# Patient Record
Sex: Female | Born: 1980 | Race: White | Hispanic: No | Marital: Married | Smoking: Current every day smoker
Health system: Southern US, Community
[De-identification: ages and names within clinical notes are randomized; demographics above are authoritative.]

## PROBLEM LIST (undated history)

## (undated) DIAGNOSIS — Z789 Other specified health status: Secondary | ICD-10-CM

## (undated) DIAGNOSIS — O24419 Gestational diabetes mellitus in pregnancy, unspecified control: Secondary | ICD-10-CM

## (undated) HISTORY — DX: Gestational diabetes mellitus in pregnancy, unspecified control: O24.419

## (undated) HISTORY — DX: Other specified health status: Z78.9

## (undated) HISTORY — PX: DILATION AND CURETTAGE OF UTERUS: SHX78

---
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
## 2005-05-20 ENCOUNTER — Ambulatory Visit: Payer: Self-pay | Admitting: Family Medicine

## 2006-09-27 IMAGING — US US PELV - US TRANSVAGINAL
1 series · 17 of 25 positions shown · non-contrast
Comparison: none

REASON FOR EXAM: Pelvic pain
COMMENTS:

PROCEDURE:     US  - US PELVIS MASS EXAM  - [DATE] [DATE] [DATE]  [DATE]
RESULT:

[Series 1: us pelv - us transvaginal · 17 of 44 slices shown]
[im 1/44]
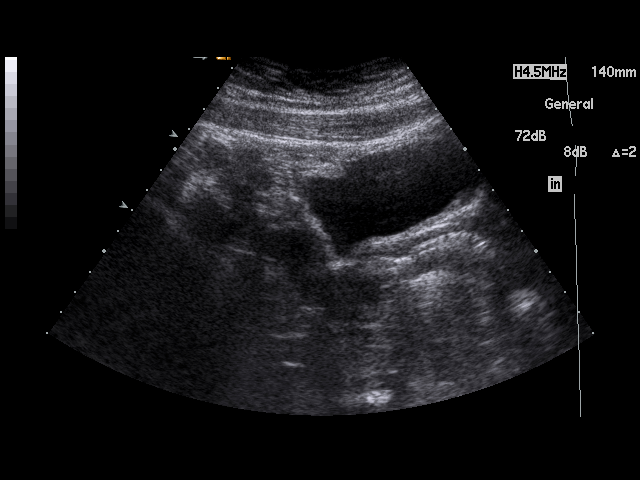
[im 4/44]
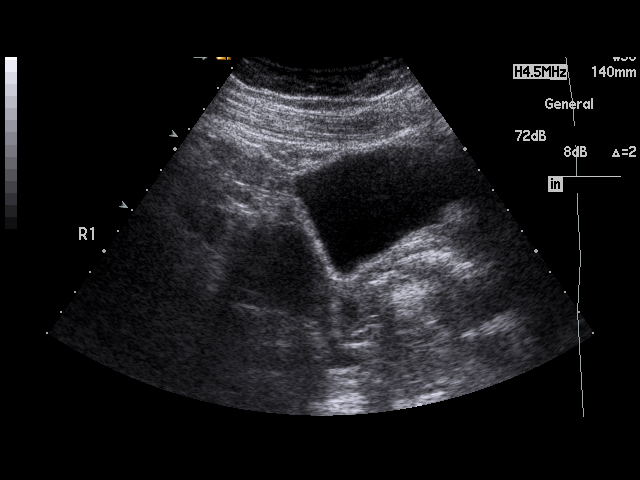
[im 6/44]
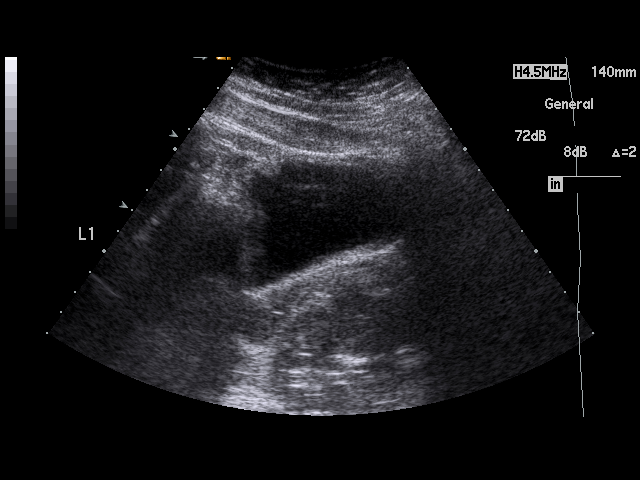
[im 9/44]
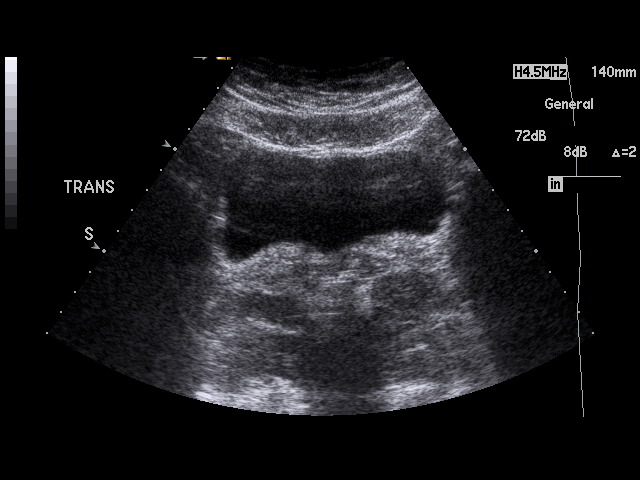
[im 11/44]
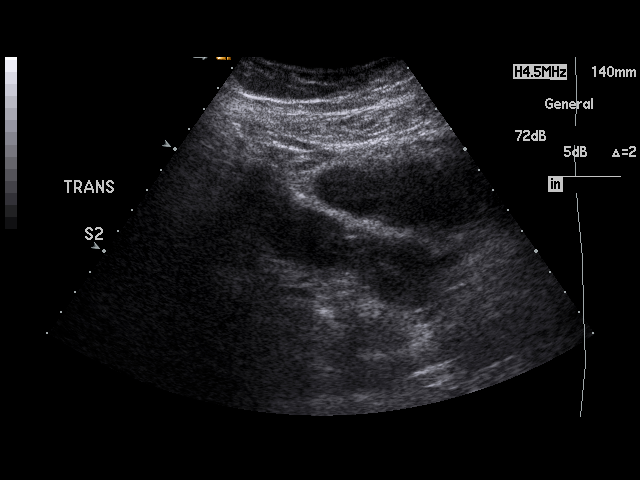
[im 15/44]
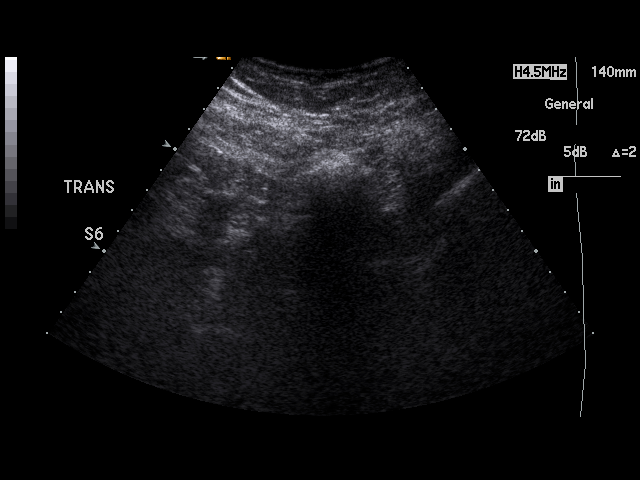
[im 17/44]
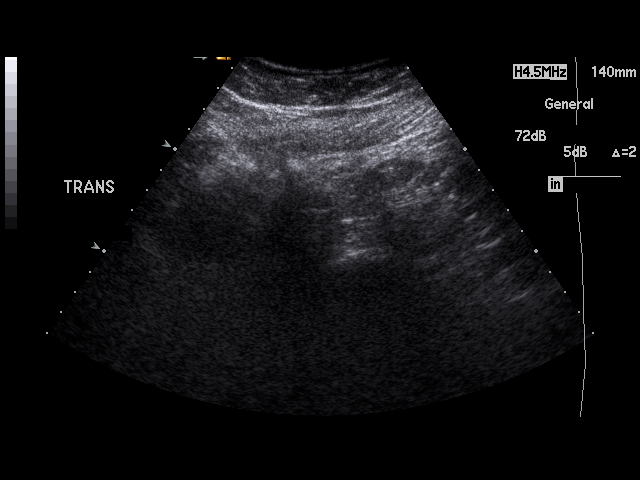
[im 20/44]
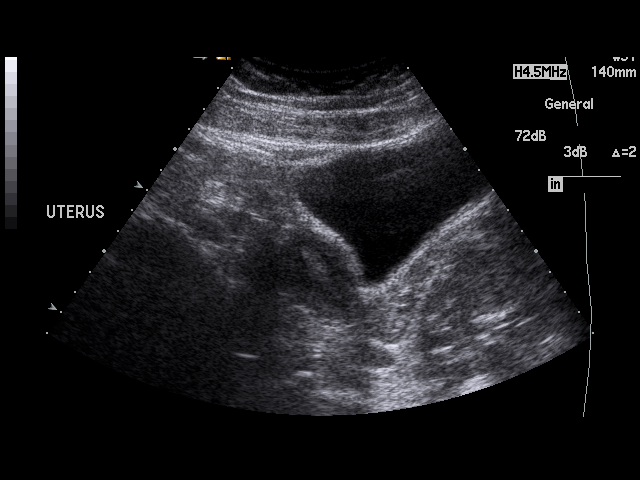
[im 22/44]
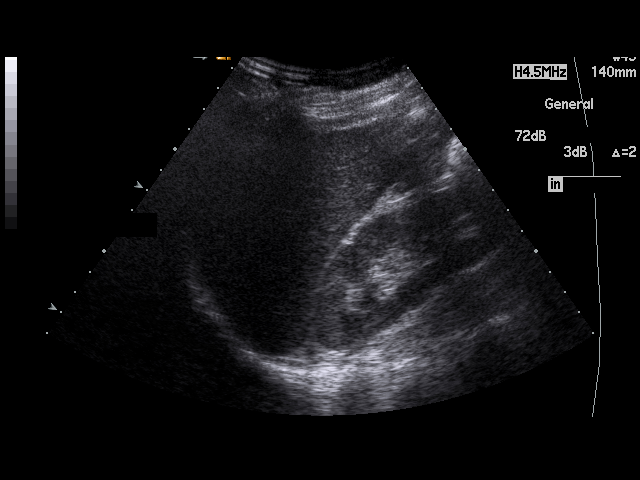
[im 24/44]
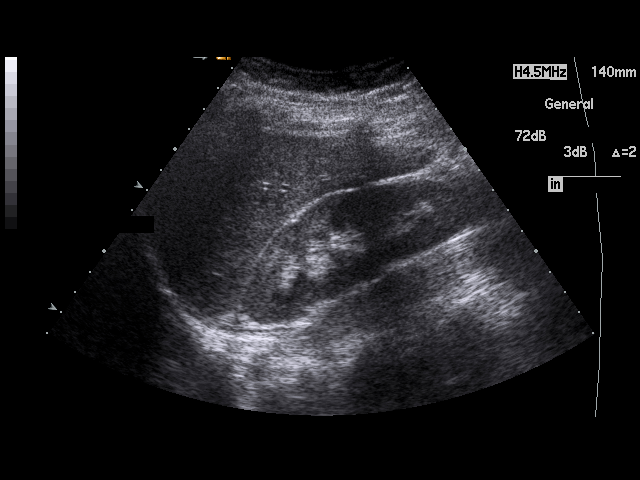
[im 27/44]
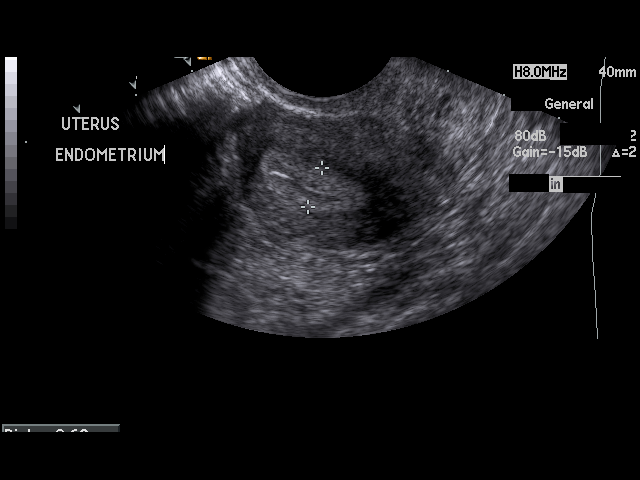
[im 29/44]
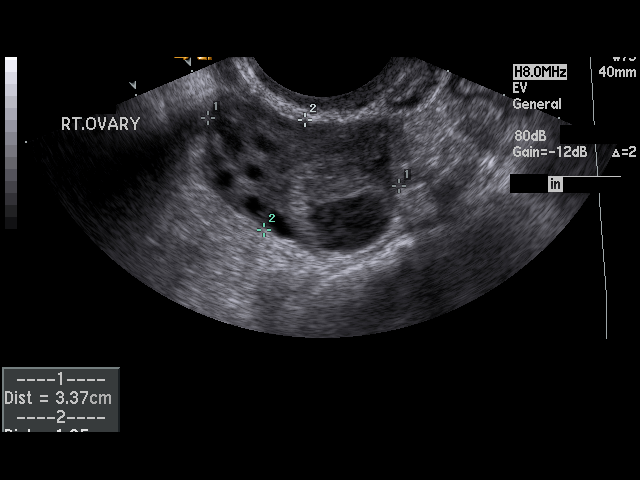
[im 33/44]
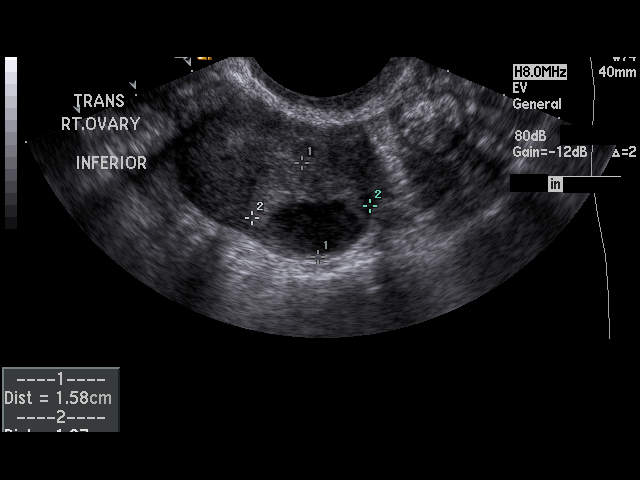
[im 35/44]
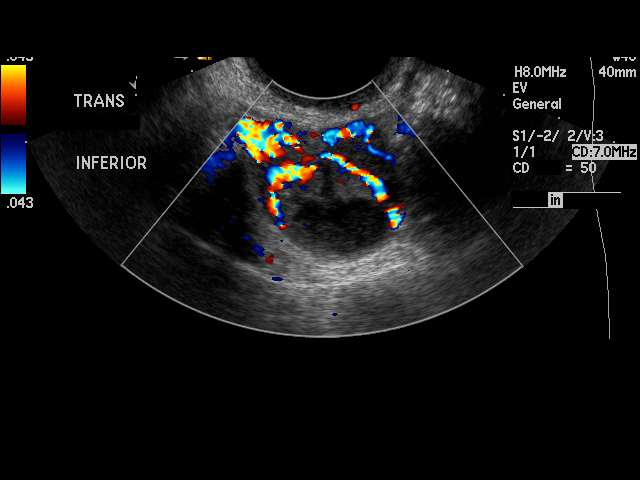
[im 38/44]
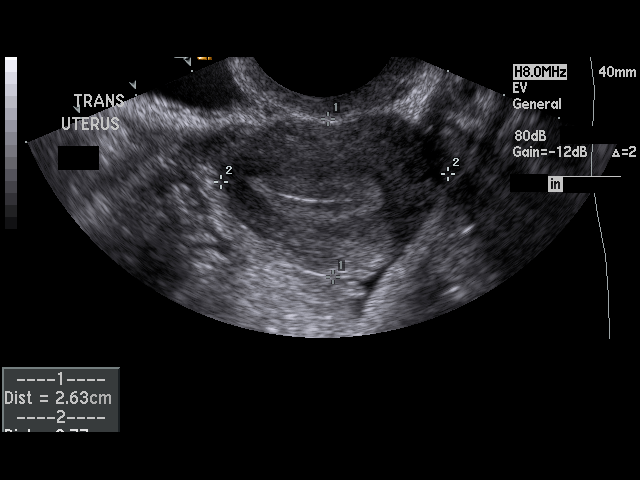
[im 40/44]
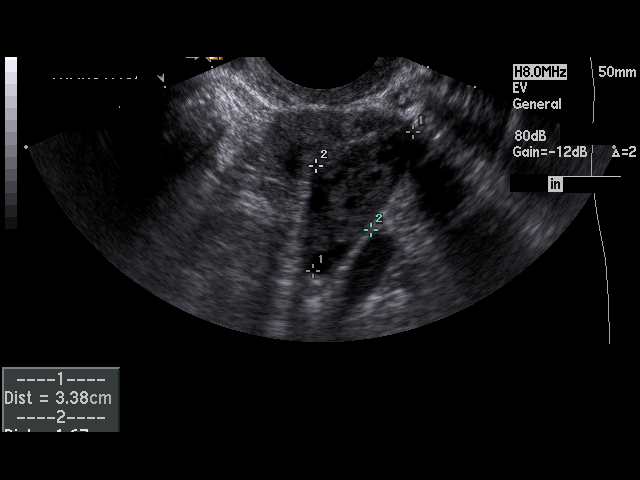
[im 44/44]
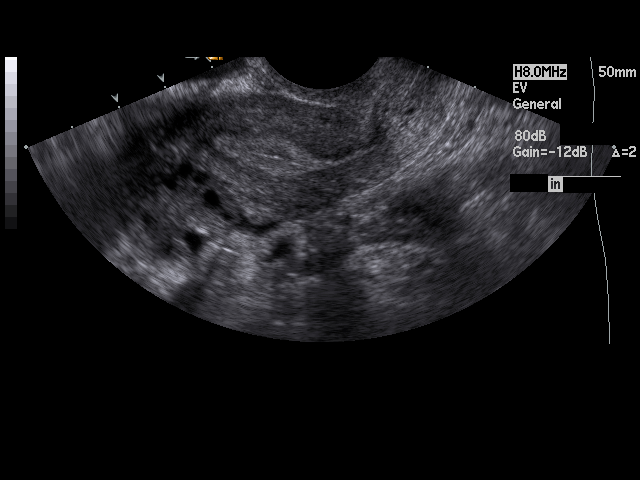

[17 of 25 positions shown; findings below may reference images not displayed]

FINDINGS: The uterus is normal.  The adnexa is normal.  The LEFT
ovary is normal, measuring 3.4 x 1.7 cm.  The RIGHT ovary contains an
approximately 2 cm complex mass.  This mass could represent hemorrhage
within a cyst or a benign ovarian tumor; however, a malignancy ovarian
lesion cannot be excluded.  It is suggested that followup pelvic ultrasound
be obtained to demonstrate resolution of this lesion.  If it doesn't, MRI of
the pelvis may be of use to evaluate for a benign or malignant tumor of the
ovary.  Other etiologies such as endometrioma, dermoid, or ectopic pregnancy
cannot be entirely excluded, and it is suggested that a pregnancy test be
obtained, as well.  No fluid is noted in the cul-de-sac.  The endometrial
stripe is normal.
IMPRESSION: 1.     A 2 cm complex mass in the RIGHT ovary as described above.  Followup
pelvic ultrasound is recommended to determine if this clears.  If it does
not, further evaluation with pelvic MRI may further characterize this
lesion. This could be a benign or malignant ovarian lesion.  To completely
ensure the absence of an ectopic pregnancy, a pregnancy test is also
suggested.
2.     The uterus is unremarkable.

## 2010-07-01 ENCOUNTER — Ambulatory Visit: Payer: Self-pay | Admitting: Obstetrics and Gynecology

## 2010-07-06 LAB — PATHOLOGY REPORT

## 2012-04-22 ENCOUNTER — Other Ambulatory Visit: Payer: Self-pay | Admitting: Internal Medicine

## 2012-07-31 ENCOUNTER — Telehealth: Payer: Self-pay | Admitting: Internal Medicine

## 2012-09-13 ENCOUNTER — Emergency Department: Payer: Self-pay | Admitting: Emergency Medicine

## 2012-09-14 LAB — COMPREHENSIVE METABOLIC PANEL
Alkaline Phosphatase: 116 U/L (ref 50–136)
BUN: 12 mg/dL (ref 7–18)
Bilirubin,Total: 0.1 mg/dL — ABNORMAL LOW (ref 0.2–1.0)
Calcium, Total: 9.2 mg/dL (ref 8.5–10.1)
Chloride: 105 mmol/L (ref 98–107)
Co2: 26 mmol/L (ref 21–32)
EGFR (Non-African Amer.): >60
Glucose: 88 mg/dL (ref 65–99)
SGOT(AST): 37 U/L (ref 15–37)
SGPT (ALT): 73 U/L (ref 12–78)
Sodium: 140 mmol/L (ref 136–145)

## 2012-09-14 LAB — CBC
HCT: 38.7 % (ref 35.0–47.0)
MCHC: 34.4 g/dL (ref 32.0–36.0)
Platelet: 260 10*3/uL (ref 150–440)
RBC: 4.41 10*6/uL (ref 3.80–5.20)
RDW: 12.8 % (ref 11.5–14.5)

## 2014-01-22 IMAGING — CT CT HEAD WITHOUT CONTRAST
2 series · 16 of 30 positions shown, 20 images · non-contrast
Comparison: none

REASON FOR EXAM: pain
COMMENTS:

PROCEDURE:     CT  - CT HEAD WITHOUT CONTRAST  - September 14, 2012 [DATE]
RESULT:     Comparison:  None
TECHNIQUE: Multiple axial images from the foramen magnum to the vertex were
obtained without IV contrast.

[Series 2: without · axial · non-contrast · 0.43mm/px · z∈[+1155,+1290]mm · 13 of 33 slices shown, 17 images]
[im 3/33  brain]
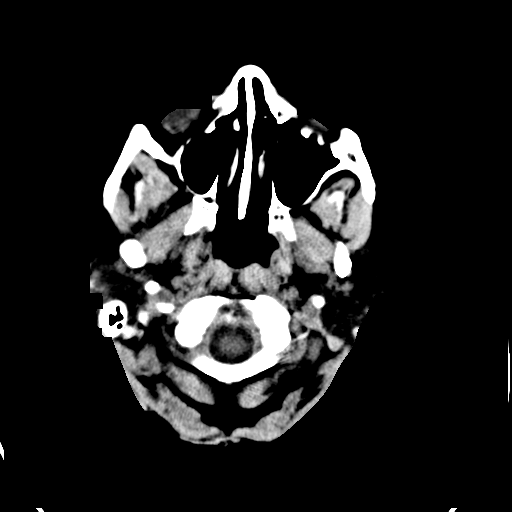
[im 3/33  bone]
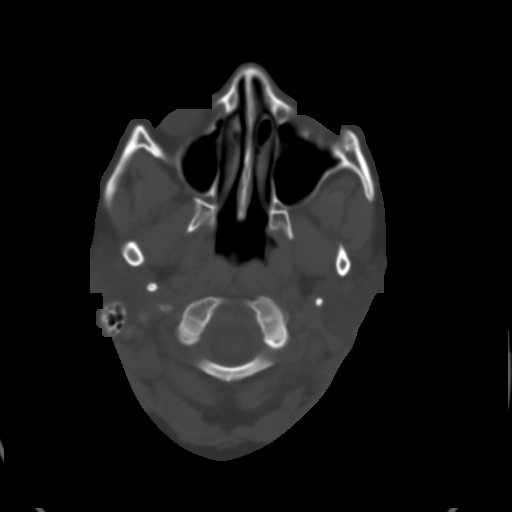
[im 5/33  brain]
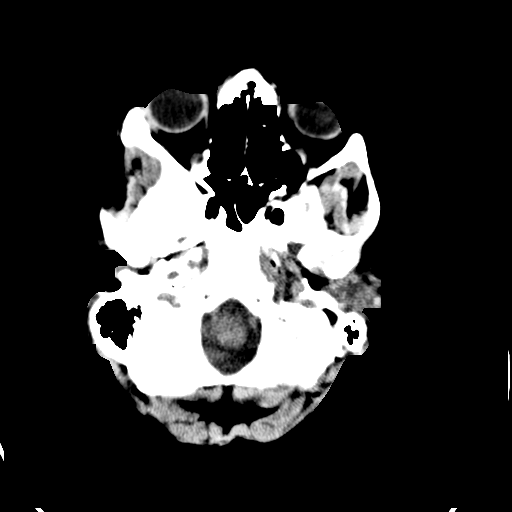
[im 7/33  brain]
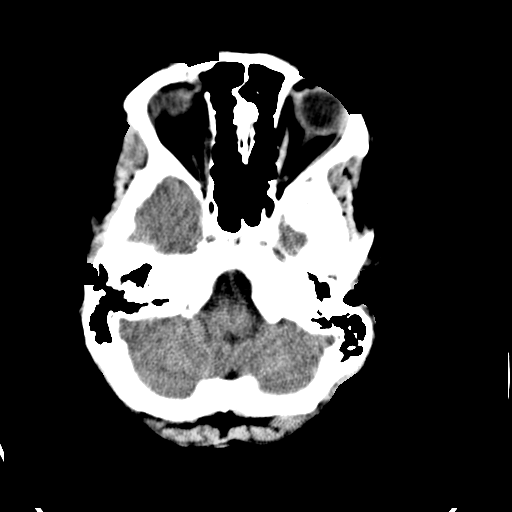
[im 10/33  brain]
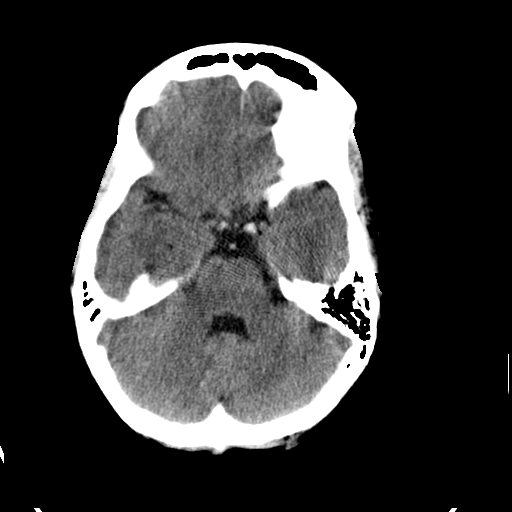
[im 12/33  brain]
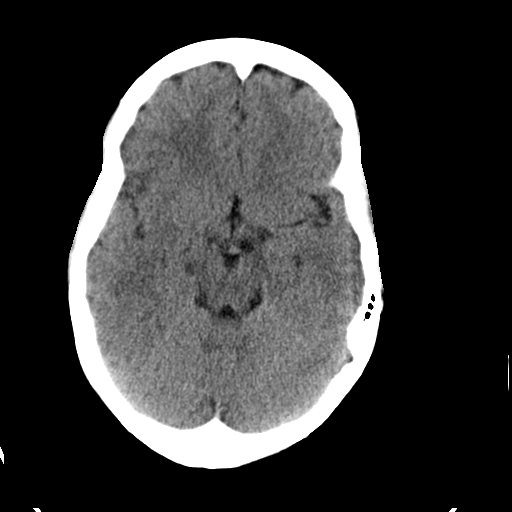
[im 12/33  bone]
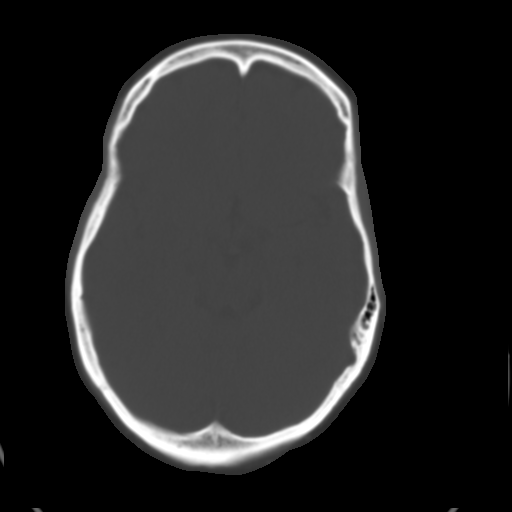
[im 14/33  brain]
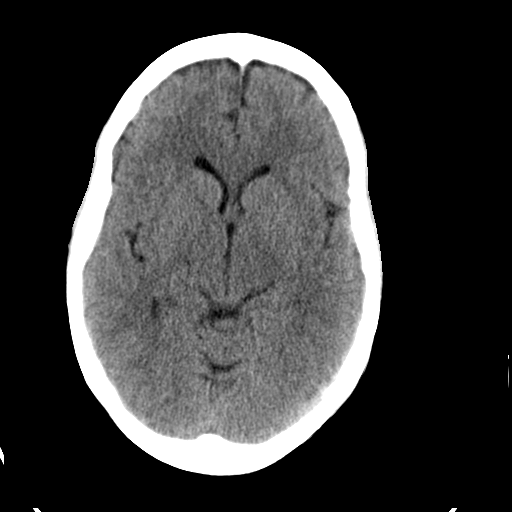
[im 17/33  brain]
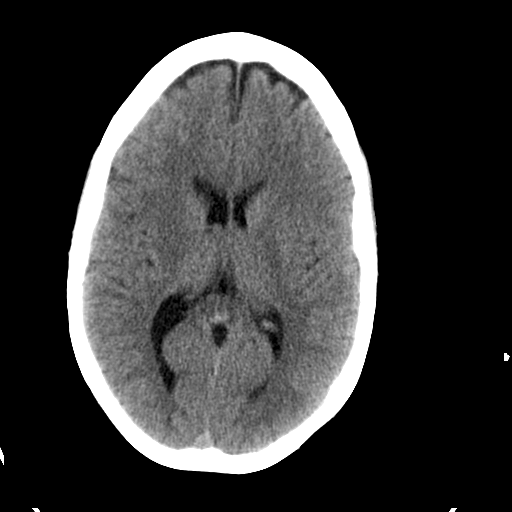
[im 19/33  brain]
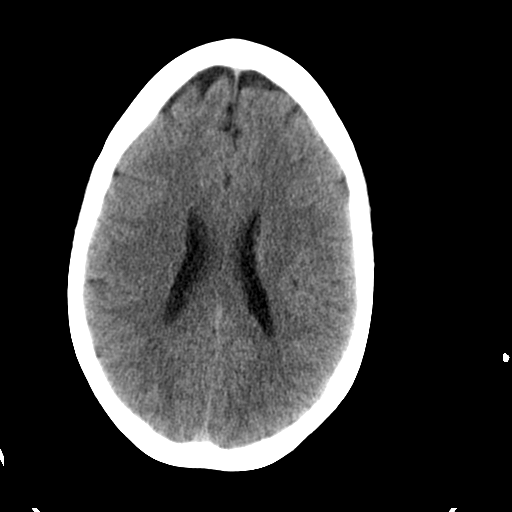
[im 21/33  brain]
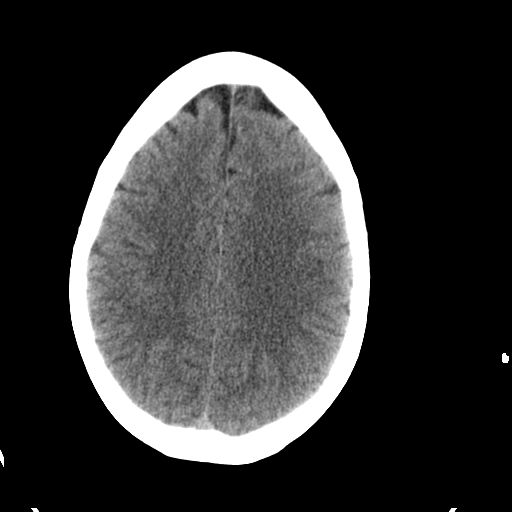
[im 21/33  bone]
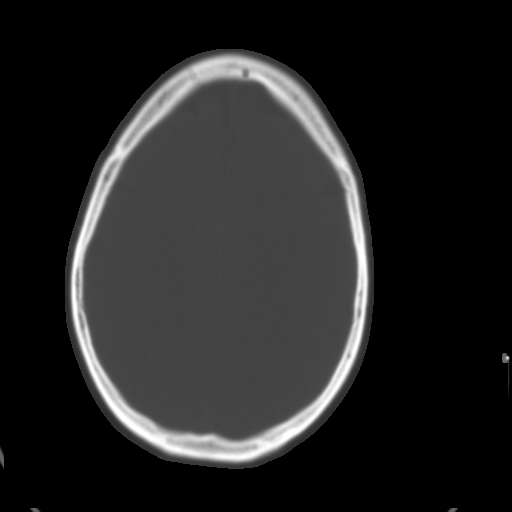
[im 23/33  brain]
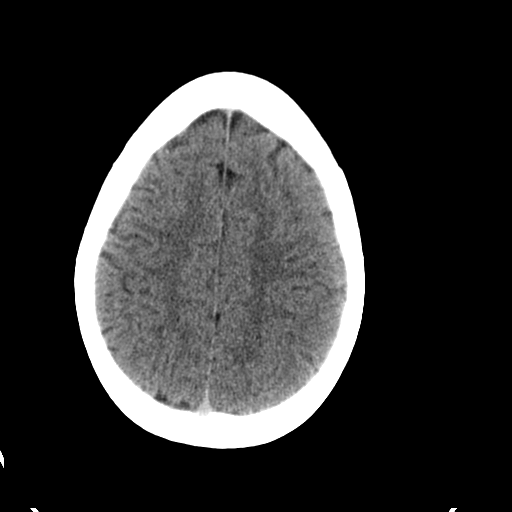
[im 26/33  brain]
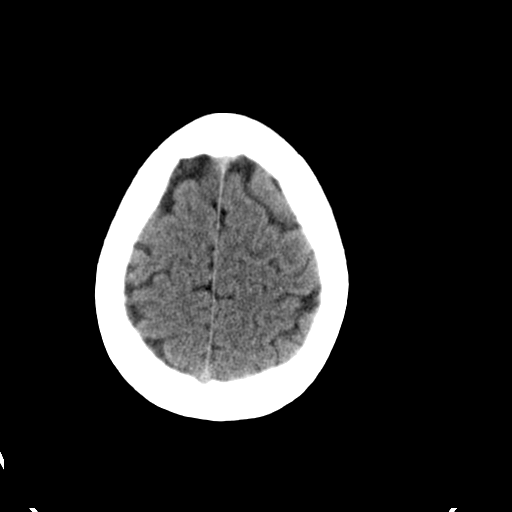
[im 28/33  brain]
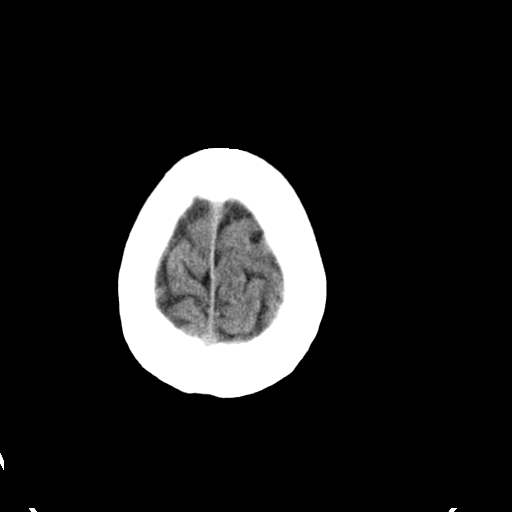
[im 30/33  brain]
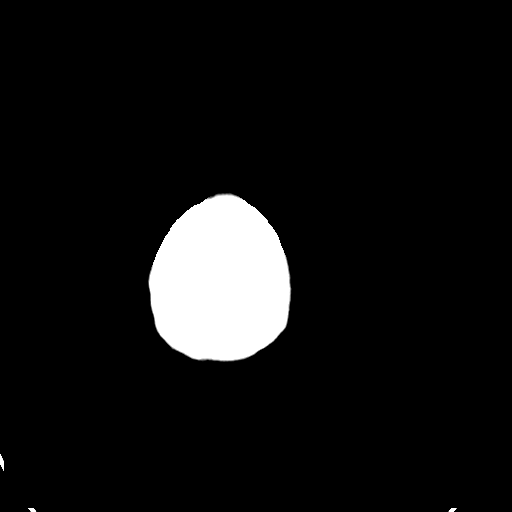
[im 30/33  bone]
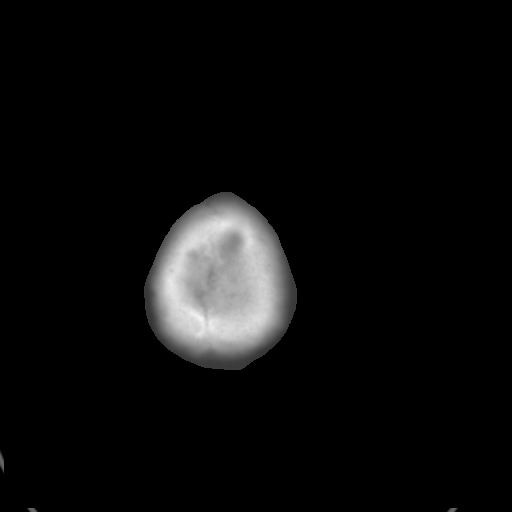

[Series 3: bone · axial · 0.43mm/px · z∈[+1155,+1200]mm · 3 of 33 slices shown]
[im 3/33  bone]
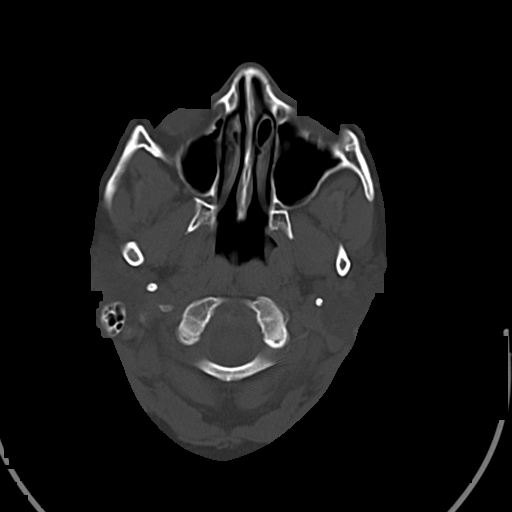
[im 7/33  bone]
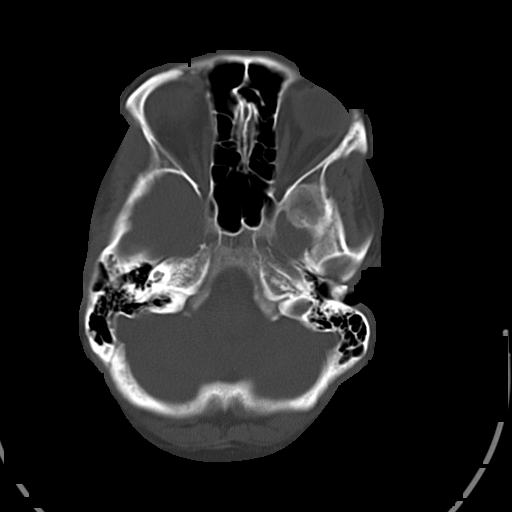
[im 12/33  bone]
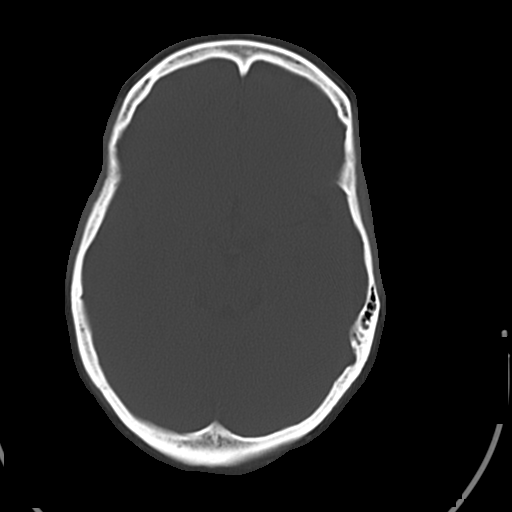

[16 of 30 positions shown; findings below may reference images not displayed]

FINDINGS: There is no evidence for mass effect, midline shift, or extra-axial fluid
collections. There is no evidence for space-occupying lesion, intracranial
hemorrhage, or cortical-based area of infarction. Small round hyperdense
focus in the skin of the right frontal scalp is nonspecific.

The osseous structures are unremarkable.
IMPRESSION: No acute intracranial process.

## 2016-11-30 NOTE — Telephone Encounter (Signed)
Error

## 2017-03-10 ENCOUNTER — Ambulatory Visit (INDEPENDENT_AMBULATORY_CARE_PROVIDER_SITE_OTHER): Payer: BLUE CROSS/BLUE SHIELD | Admitting: Obstetrics and Gynecology

## 2017-03-10 ENCOUNTER — Encounter: Payer: Self-pay | Admitting: Obstetrics and Gynecology

## 2017-03-10 VITALS — BP 102/62 | Ht 63.0 in | Wt 155.0 lb

## 2017-03-10 DIAGNOSIS — O0991 Supervision of high risk pregnancy, unspecified, first trimester: Secondary | ICD-10-CM | POA: Insufficient documentation

## 2017-03-10 DIAGNOSIS — O09521 Supervision of elderly multigravida, first trimester: Secondary | ICD-10-CM | POA: Insufficient documentation

## 2017-03-10 DIAGNOSIS — Z3A10 10 weeks gestation of pregnancy: Secondary | ICD-10-CM

## 2017-03-10 NOTE — Patient Instructions (Signed)
First Trimester of Pregnancy The first trimester of pregnancy is from week 1 until the end of week 13 (months 1 through 3). During this time, your baby will begin to develop inside you. At 6-8 weeks, the eyes and face are formed, and the heartbeat can be seen on ultrasound. At the end of 12 weeks, all the baby's organs are formed. Prenatal care is all the medical care you receive before the birth of your baby. Make sure you get good prenatal care and follow all of your doctor's instructions. Follow these instructions at home: Medicines  Take over-the-counter and prescription medicines only as told by your doctor. Some medicines are safe and some medicines are not safe during pregnancy.  Take a prenatal vitamin that contains at least 600 micrograms (mcg) of folic acid.  If you have trouble pooping (constipation), take medicine that will make your stool soft (stool softener) if your doctor approves. Eating and drinking  Eat regular, healthy meals.  Your doctor will tell you the amount of weight gain that is right for you.  Avoid raw meat and uncooked cheese.  If you feel sick to your stomach (nauseous) or throw up (vomit): ? Eat 4 or 5 small meals a day instead of 3 large meals. ? Try eating a few soda crackers. ? Drink liquids between meals instead of during meals.  To prevent constipation: ? Eat foods that are high in fiber, like fresh fruits and vegetables, whole grains, and beans. ? Drink enough fluids to keep your pee (urine) clear or pale yellow. Activity  Exercise only as told by your doctor. Stop exercising if you have cramps or pain in your lower belly (abdomen) or low back.  Do not exercise if it is too hot, too humid, or if you are in a place of great height (high altitude).  Try to avoid standing for long periods of time. Move your legs often if you must stand in one place for a long time.  Avoid heavy lifting.  Wear low-heeled shoes. Sit and stand up straight.  You  can have sex unless your doctor tells you not to. Relieving pain and discomfort  Wear a good support bra if your breasts are sore.  Take warm water baths (sitz baths) to soothe pain or discomfort caused by hemorrhoids. Use hemorrhoid cream if your doctor says it is okay.  Rest with your legs raised if you have leg cramps or low back pain.  If you have puffy, bulging veins (varicose veins) in your legs: ? Wear support hose or compression stockings as told by your doctor. ? Raise (elevate) your feet for 15 minutes, 3-4 times a day. ? Limit salt in your food. Prenatal care  Schedule your prenatal visits by the twelfth week of pregnancy.  Write down your questions. Take them to your prenatal visits.  Keep all your prenatal visits as told by your doctor. This is important. Safety  Wear your seat belt at all times when driving.  Make a list of emergency phone numbers. The list should include numbers for family, friends, the hospital, and police and fire departments. General instructions  Ask your doctor for a referral to a local prenatal class. Begin classes no later than at the start of month 6 of your pregnancy.  Ask for help if you need counseling or if you need help with nutrition. Your doctor can give you advice or tell you where to go for help.  Do not use hot tubs, steam rooms, or   saunas.  Do not douche or use tampons or scented sanitary pads.  Do not cross your legs for long periods of time.  Avoid all herbs and alcohol. Avoid drugs that are not approved by your doctor.  Do not use any tobacco products, including cigarettes, chewing tobacco, and electronic cigarettes. If you need help quitting, ask your doctor. You may get counseling or other support to help you quit.  Avoid cat litter boxes and soil used by cats. These carry germs that can cause birth defects in the baby and can cause a loss of your baby (miscarriage) or stillbirth.  Visit your dentist. At home, brush  your teeth with a soft toothbrush. Be gentle when you floss. Contact a doctor if:  You are dizzy.  You have mild cramps or pressure in your lower belly.  You have a nagging pain in your belly area.  You continue to feel sick to your stomach, you throw up, or you have watery poop (diarrhea).  You have a bad smelling fluid coming from your vagina.  You have pain when you pee (urinate).  You have increased puffiness (swelling) in your face, hands, legs, or ankles. Get help right away if:  You have a fever.  You are leaking fluid from your vagina.  You have spotting or bleeding from your vagina.  You have very bad belly cramping or pain.  You gain or lose weight rapidly.  You throw up blood. It may look like coffee grounds.  You are around people who have German measles, fifth disease, or chickenpox.  You have a very bad headache.  You have shortness of breath.  You have any kind of trauma, such as from a fall or a car accident. Summary  The first trimester of pregnancy is from week 1 until the end of week 13 (months 1 through 3).  To take care of yourself and your unborn baby, you will need to eat healthy meals, take medicines only if your doctor tells you to do so, and do activities that are safe for you and your baby.  Keep all follow-up visits as told by your doctor. This is important as your doctor will have to ensure that your baby is healthy and growing well. This information is not intended to replace advice given to you by your health care provider. Make sure you discuss any questions you have with your health care provider. Document Released: 08/10/2007 Document Revised: 03/01/2016 Document Reviewed: 03/01/2016 Elsevier Interactive Patient Education  2017 [employer]  

## 2017-03-10 NOTE — Progress Notes (Signed)
03/10/2017   Chief Complaint: Missed period  Transfer of Care Patient: no  History of Present Illness: Ms. Catherine Perez is a 37 y.o. G2P0010 4193w2d based on Patient's last menstrual period was 12/28/2016 (exact date). with an Estimated Date of Delivery: 10/04/17, with the above CC.   Pt reports no problems. NOB today. Pregnancy confirmed at [facility].   Her periods were: irregular periods . Goes 3-5 months at times without periods. She was using no method when she conceived.  She has Negative signs or symptoms of nausea/vomiting of pregnancy. She has Negative signs or symptoms of miscarriage or preterm labor She identifies Negative Zika risk factors for her and her partner On any different medications around the time she conceived/early pregnancy: No  History of varicella: Yes   ROS: A 12-point review of systems was performed and negative, except as stated in the above HPI.  OBGYN History: As per HPI. OB History  Gravida Para Term Preterm AB Living  2       1    SAB TAB Ectopic Multiple Live Births  1            # Outcome Date GA Lbr Len/2nd Weight Sex Delivery Anes PTL Lv  2 Current           1 SAB               Any issues with any prior pregnancies: not applicable Any prior children are healthy, doing well, without any problems or issues: not applicable History of pap smears: Yes. Last pap smear Unknown, says it has been several years since she had a pap smear. History of STIs: No   Past Medical History: Past Medical History:  Diagnosis Date  . No known health problems     Past Surgical History: Past Surgical History:  Procedure Laterality Date  . DILATION AND CURETTAGE OF UTERUS     SAB    Family History:  Family History  Problem Relation Age of Onset  . Cancer Neg Hx   . Diabetes Neg Hx   . Heart disease Neg Hx   . Stroke Neg Hx    She denies any female cancers, bleeding or blood clotting disorders.  She denies any history of mental retardation, birth defects or  genetic disorders in her or the FOB's history  Social History:  Social History   Socioeconomic History  . Marital status: Single    Spouse name: Not on file  . Number of children: Not on file  . Years of education: Not on file  . Highest education level: Not on file  Social Needs  . Financial resource strain: Not on file  . Food insecurity - worry: Not on file  . Food insecurity - inability: Not on file  . Transportation needs - medical: Not on file  . Transportation needs - non-medical: Not on file  Occupational History  . Not on file  Tobacco Use  . Smoking status: Current Every Day Smoker    Packs/day: 0.25    Types: Cigarettes  . Smokeless tobacco: Never Used  Substance and Sexual Activity  . Alcohol use: No    Frequency: Never  . Drug use: No  . Sexual activity: Yes    Birth control/protection: None  Other Topics Concern  . Not on file  Social History Narrative  . Not on file   Any pets in the household: no No cats in home.   Allergy: Allergies  Allergen Reactions  . Penicillins Other (See  Comments)    Unsure happened as a child      Current Outpatient Medications: No current outpatient medications on file.   Physical Exam:   BP 102/62   Ht 5\' 3"  (1.6 m)   Wt 155 lb (70.3 kg)   LMP 12/28/2016 (Exact Date)   BMI 27.46 kg/m  Body mass index is 27.46 kg/m. Constitutional: Well nourished, well developed female in no acute distress.  Neck:  Supple, normal appearance, and no thyromegaly  Cardiovascular: S1, S2 normal, no murmur, rub or gallop, regular rate and rhythm Respiratory:  Clear to auscultation bilateral. Normal respiratory effort Abdomen: positive bowel sounds and no masses, hernias; diffusely non tender to palpation, non distended Breasts: breasts appear normal, no suspicious masses, no skin or nipple changes or axillary nodes. Neuro/Psych:  Normal mood and affect.  Skin:  Warm and dry.  Lymphatic:  No inguinal lymphadenopathy.   Pelvic  exam: is not limited by body habitus EGBUS: within normal limits, Vagina: within normal limits and with no blood in the vault, Cervix: normal appearing cervix without discharge or lesions, closed/long/high, Uterus: enlarged: 10cm, and Adnexa:  normal adnexa and no mass, fullness, tenderness  Bedside abdominal US showed a CRL that would correspond with her EDD by LMP.   Assessment: Ms. Catherine Miner is a 37 y.o. G2P0010 [redacted]w[redacted]d based on Patient's last menstrual period was 12/28/2016 (exact date). with an Estimated Date of Delivery: 10/04/17,  for prenatal care.  Plan:  1) Avoid alcoholic beverages. 2) Patient encouraged not to smoke. She does desire to quit.  3) Discontinue the use of all non-medicinal drugs and chemicals.  4) Take prenatal vitamins daily.  5) Seatbelt use advised 6) Nutrition, food safety (fish, cheese advisories, and high nitrite foods) and exercise discussed. 7) Hospital and practice style delivering at [facility] discussed  8) Patient is asked about travel to areas at risk for the Zika virus, and counseled to avoid travel and exposure to mosquitoes or sexual partners who may have themselves been exposed to the virus. Testing is discussed, and will be ordered as appropriate.  9) Childbirth classes at [facility] advised 10) Genetic Screening, such as with 1st Trimester Screening, cell free fetal DNA, AFP testing, and Ultrasound, as well as with amniocentesis and CVS as appropriate, is discussed with patient. She plans to consider genetic testing this pregnancy. Undecided, advised NIPT due to age.  11) Pap performed today 12) Return in 2 weeks for dating [place] and ROB, decided genetic screening at that time 74) Declines flu vaccination  Problem list reviewed and updated.  Adelene Idler, MD [facility], [facility] 03/10/2017  11:01 AM

## 2017-03-11 LAB — RPR+RH+ABO+RUB AB+AB SCR+CB...
ANTIBODY SCREEN: NEGATIVE
HIV Screen 4th Generation wRfx: NONREACTIVE
Hematocrit: 36.8 % (ref 34.0–46.6)
Hemoglobin: 12.5 g/dL (ref 11.1–15.9)
Hepatitis B Surface Ag: NEGATIVE
MCH: 30 pg (ref 26.6–33.0)
MCHC: 34 g/dL (ref 31.5–35.7)
MCV: 88 fL (ref 79–97)
PLATELETS: 386 10*3/uL — AB (ref 150–379)
RBC: 4.17 x10E6/uL (ref 3.77–5.28)
RDW: 13.5 % (ref 12.3–15.4)
RH TYPE: POSITIVE
RPR: NONREACTIVE
Rubella Antibodies, IGG: 18 index (ref >0.99)
Varicella zoster IgG: 2383 index (ref >165)
WBC: 9.2 10*3/uL (ref 3.4–10.8)

## 2017-03-12 LAB — URINE DRUG PANEL 7
Amphetamines, Urine: NEGATIVE ng/mL
BARBITURATE QUANT UR: NEGATIVE ng/mL
Benzodiazepine Quant, Ur: NEGATIVE ng/mL
CANNABINOID QUANT UR: NEGATIVE ng/mL
COCAINE (METAB.): NEGATIVE ng/mL
Opiate Quant, Ur: NEGATIVE ng/mL
PCP Quant, Ur: NEGATIVE ng/mL

## 2017-03-12 LAB — URINE CULTURE

## 2017-03-15 LAB — PAPIG, CTNGTV, HPV, RFX 16/18
Chlamydia, Nuc. Acid Amp: NEGATIVE
GONOCOCCUS, NUC. ACID AMP: NEGATIVE
HPV, HIGH-RISK: NEGATIVE
PAP Smear Comment: 0
Trich vag by NAA: NEGATIVE

## 2017-03-16 ENCOUNTER — Encounter: Payer: Self-pay | Admitting: Obstetrics and Gynecology

## 2017-03-23 NOTE — Progress Notes (Signed)
Released to Mychart

## 2017-03-27 ENCOUNTER — Ambulatory Visit (INDEPENDENT_AMBULATORY_CARE_PROVIDER_SITE_OTHER): Payer: BLUE CROSS/BLUE SHIELD

## 2017-03-27 ENCOUNTER — Encounter: Payer: Self-pay | Admitting: Advanced Practice Midwife

## 2017-03-27 ENCOUNTER — Other Ambulatory Visit: Payer: Self-pay | Admitting: Obstetrics and Gynecology

## 2017-03-27 ENCOUNTER — Ambulatory Visit (INDEPENDENT_AMBULATORY_CARE_PROVIDER_SITE_OTHER): Payer: BLUE CROSS/BLUE SHIELD | Admitting: Advanced Practice Midwife

## 2017-03-27 VITALS — BP 130/76 | Wt 161.0 lb

## 2017-03-27 DIAGNOSIS — Z362 Encounter for other antenatal screening follow-up: Secondary | ICD-10-CM | POA: Diagnosis not present

## 2017-03-27 DIAGNOSIS — O09521 Supervision of elderly multigravida, first trimester: Secondary | ICD-10-CM

## 2017-03-27 DIAGNOSIS — Z3A12 12 weeks gestation of pregnancy: Secondary | ICD-10-CM

## 2017-03-27 DIAGNOSIS — Z1379 Encounter for other screening for genetic and chromosomal anomalies: Secondary | ICD-10-CM

## 2017-03-27 NOTE — Progress Notes (Signed)
No concerns.rj 

## 2017-03-27 NOTE — Patient Instructions (Signed)

## 2017-03-27 NOTE — Progress Notes (Signed)
Routine Prenatal Care Visit  Subjective  Catherine Perez is a [age] y.o. G2P0010 at 3333w5d being seen today for ongoing prenatal care.  She is currently monitored for the following issues for this high-risk pregnancy and has Pregnancy, supervision, high-risk, first trimester and Advanced maternal age in multigravida, first trimester on their problem list.  ----------------------------------------------------------------------------------- Patient reports no complaints.   Contractions: Not present. Vag. Bleeding: None.   . Denies leaking of fluid.  ----------------------------------------------------------------------------------- The following portions of the patient's history were reviewed and updated as appropriate: allergies, current medications, past family history, past medical history, past social history, past surgical history and problem list. Problem list updated.   Objective  Blood pressure 130/76, weight 161 lb (73 kg), last menstrual period 12/28/2016. Pregravid weight 155 lb (70.3 kg) Total Weight Gain 6 lb (2.722 kg) Urinalysis: Urine Protein: Negative Urine Glucose: Negative  Fetal Status: Fetal Heart Rate (bpm): 158         Dating u/s today agrees with LMP within 3 days.  General:  Alert, oriented and cooperative. Patient is in no acute distress.  Skin: Skin is warm and dry. No rash noted.   Cardiovascular: Normal heart rate noted  Respiratory: Normal respiratory effort, no problems with respiration noted  Abdomen: Soft, gravid, appropriate for gestational age. Pain/Pressure: Absent     Pelvic:  Cervical exam deferred        Extremities: Normal range of motion.     Mental Status: Normal mood and affect. Normal behavior. Normal judgment and thought content.   Assessment   37 y.o. G2P0010 at 5333w5d by  10/04/2017, by Last Menstrual Period presenting for routine prenatal visit  Plan   pregnancy #2 Problems (from 03/10/17 to present)    Problem Noted Resolved   Pregnancy, supervision, high-risk, first trimester 03/10/2017 by Natale MilchSchuman, Christanna R, MD No   Overview Signed 03/10/2017 10:50 AM by Natale MilchSchuman, Christanna R, MD      Clinic [facility] Prenatal Labs  Dating  Blood type:     Genetic Screen 1 Screen:     AFP:      Quad:      NIPS: 03/27/17   Antibody:   Anatomic US  Rubella:   Varicella: @VZVIGG @  GTT Early:        28 wk:      RPR:     Rhogam  HBsAg:     TDaP vaccine                       HIV:     Flu Shot   Declines                             GBS:   Contraception  Pap:  CBB     CS/VBAC NA   Baby Food    Support Person  Partner Thurmond ButtsWade           Advanced maternal age in multigravida, first trimester 03/10/2017 by Natale MilchSchuman, Christanna R, MD No   Overview Signed 03/10/2017 10:49 AM by Natale MilchSchuman, Christanna R, MD    [ ]  NIPT offered         NIPT screening today  Preterm labor symptoms and general obstetric precautions including but not limited to vaginal bleeding, contractions, leaking of fluid and fetal movement were reviewed in detail with the patient. Please refer to After Visit Summary for other counseling recommendations.    Return in about 4 weeks (around 04/24/2017)  for rob.  Tresea Mall, CNM  03/27/2017 9:53 AM

## 2017-04-05 ENCOUNTER — Encounter: Payer: Self-pay | Admitting: Obstetrics and Gynecology

## 2017-04-16 ENCOUNTER — Other Ambulatory Visit: Payer: Self-pay

## 2017-04-16 ENCOUNTER — Emergency Department
Admission: EM | Admit: 2017-04-16 | Discharge: 2017-04-16 | Disposition: A | Discharge status: Home / Self Care | Payer: BLUE CROSS/BLUE SHIELD | Attending: Emergency Medicine | Admitting: Emergency Medicine

## 2017-04-16 DIAGNOSIS — F17210 Nicotine dependence, cigarettes, uncomplicated: Secondary | ICD-10-CM | POA: Diagnosis not present

## 2017-04-16 DIAGNOSIS — O99332 Smoking (tobacco) complicating pregnancy, second trimester: Secondary | ICD-10-CM | POA: Diagnosis not present

## 2017-04-16 DIAGNOSIS — O9989 Other specified diseases and conditions complicating pregnancy, childbirth and the puerperium: Secondary | ICD-10-CM | POA: Diagnosis not present

## 2017-04-16 DIAGNOSIS — R112 Nausea with vomiting, unspecified: Secondary | ICD-10-CM

## 2017-04-16 DIAGNOSIS — Z3A16 16 weeks gestation of pregnancy: Secondary | ICD-10-CM | POA: Diagnosis not present

## 2017-04-16 DIAGNOSIS — O99891 Other specified diseases and conditions complicating pregnancy: Secondary | ICD-10-CM

## 2017-04-16 DIAGNOSIS — R197 Diarrhea, unspecified: Secondary | ICD-10-CM

## 2017-04-16 DIAGNOSIS — O26812 Pregnancy related exhaustion and fatigue, second trimester: Secondary | ICD-10-CM | POA: Diagnosis present

## 2017-04-16 DIAGNOSIS — O99612 Diseases of the digestive system complicating pregnancy, second trimester: Secondary | ICD-10-CM | POA: Diagnosis not present

## 2017-04-16 DIAGNOSIS — R8271 Bacteriuria: Secondary | ICD-10-CM | POA: Diagnosis not present

## 2017-04-16 DIAGNOSIS — A084 Viral intestinal infection, unspecified: Secondary | ICD-10-CM

## 2017-04-16 LAB — CBC
HEMATOCRIT: 36.6 % (ref 35.0–47.0)
Hemoglobin: 12.5 g/dL (ref 12.0–16.0)
MCH: 29.9 pg (ref 26.0–34.0)
MCHC: 34.3 g/dL (ref 32.0–36.0)
MCV: 87.1 fL (ref 80.0–100.0)
PLATELETS: 368 10*3/uL (ref 150–440)
RBC: 4.2 MIL/uL (ref 3.80–5.20)
RDW: 13.5 % (ref 11.5–14.5)
WBC: 13.1 10*3/uL — AB (ref 3.6–11.0)

## 2017-04-16 LAB — URINALYSIS, COMPLETE (UACMP) WITH MICROSCOPIC
BILIRUBIN URINE: NEGATIVE
GLUCOSE, UA: NEGATIVE mg/dL
HGB URINE DIPSTICK: NEGATIVE
KETONES UR: NEGATIVE mg/dL
LEUKOCYTES UA: NEGATIVE
NITRITE: NEGATIVE
PH: 7 (ref 5.0–8.0)
Protein, ur: NEGATIVE mg/dL
SPECIFIC GRAVITY, URINE: 1.005 (ref 1.005–1.030)

## 2017-04-16 LAB — COMPREHENSIVE METABOLIC PANEL
ALT: 27 U/L (ref 14–54)
AST: 22 U/L (ref 15–41)
Albumin: 3.7 g/dL (ref 3.5–5.0)
Alkaline Phosphatase: 47 U/L (ref 38–126)
Anion gap: 8 (ref 5–15)
BILIRUBIN TOTAL: 0.3 mg/dL (ref 0.3–1.2)
BUN: 6 mg/dL (ref 6–20)
CHLORIDE: 108 mmol/L (ref 101–111)
CO2: 20 mmol/L — ABNORMAL LOW (ref 22–32)
CREATININE: 0.43 mg/dL — AB (ref 0.44–1.00)
Calcium: 9.1 mg/dL (ref 8.9–10.3)
Glucose, Bld: 111 mg/dL — ABNORMAL HIGH (ref 65–99)
Potassium: 3.6 mmol/L (ref 3.5–5.1)
Sodium: 136 mmol/L (ref 135–145)
TOTAL PROTEIN: 6.8 g/dL (ref 6.5–8.1)

## 2017-04-16 LAB — INFLUENZA PANEL BY PCR (TYPE A & B)
INFLBPCR: NEGATIVE
Influenza A By PCR: NEGATIVE

## 2017-04-16 LAB — LIPASE, BLOOD: LIPASE: 22 U/L (ref 11–51)

## 2017-04-16 MED ORDER — NITROFURANTOIN MONOHYD MACRO 100 MG PO CAPS: 100.0000 mg | ORAL_CAPSULE | Freq: Two times a day (BID) | ORAL | 0 refills | Status: AC

## 2017-04-16 MED ORDER — METOCLOPRAMIDE HCL 5 MG/ML IJ SOLN
INTRAMUSCULAR | Status: AC
  Administered 2017-04-16: 10 mg via INTRAVENOUS
  Filled 2017-04-16: qty 2

## 2017-04-16 MED ORDER — METOCLOPRAMIDE HCL 5 MG/ML IJ SOLN
10.0000 mg | Freq: Once | INTRAMUSCULAR | Status: AC
  Administered 2017-04-16: 10 mg via INTRAVENOUS

## 2017-04-16 MED ORDER — SODIUM CHLORIDE 0.9 % IV BOLUS (SEPSIS)
1000.0000 mL | Freq: Once | INTRAVENOUS | Status: AC
  Administered 2017-04-16: 1000 mL via INTRAVENOUS

## 2017-04-16 MED ORDER — METOCLOPRAMIDE HCL 10 MG PO TABS: 10.0000 mg | ORAL_TABLET | Freq: Three times a day (TID) | ORAL | 0 refills | Status: DC | PRN

## 2017-04-16 MED ORDER — NITROFURANTOIN MONOHYD MACRO 100 MG PO CAPS
100.0000 mg | ORAL_CAPSULE | Freq: Once | ORAL | Status: AC
  Administered 2017-04-16: 100 mg via ORAL
  Filled 2017-04-16: qty 1

## 2017-04-16 NOTE — ED Triage Notes (Addendum)
Pt to ED reporting dizziness that started this morning and has worsened throughout the day. Pt reports she started vomiting this afternoon and has since had 4 episodes of vomiting. No fevers but pt had first episode of diarrhea upon arrival to ED. No changes in urine. No complications with pregnancy to this point and no vaginal bleeding or discharge reported.   Pt is [redacted] weeks pregnant

## 2017-04-16 NOTE — ED Provider Notes (Signed)
[facility] Emergency Department Provider Note  ____________________________________________  Time seen: Approximately 7:16 PM  I have reviewed the triage vital signs and the nursing notes.   HISTORY  Chief Complaint Dizziness and Emesis   HPI Catherine Perez is a 37 y.o. female G2P0 at [redacted]weeks GA by US done 03/27/17 who presents for evaluation of flulike symptoms. Patient reports that she woke up this morning with constant moderate body aches and malaise. She reports episodes of dizziness which she describes as room spinning every time she tries to stand up. She has had 3 episodes of nonbloody nonbilious emesis and watery diarrhea. She has chills at home but has not checked her temperature. No vaginal bleeding, no vaginal discharge, no dysuria or hematuria. No abdominal pain or contractions. No chest pain or shortness of breath. No cough or congestion, no sore throat.  Past Medical History:  Diagnosis Date  . No known health problems     Patient Active Problem List   Diagnosis Date Noted  . Pregnancy, supervision, high-risk, first trimester 03/10/2017  . Advanced maternal age in multigravida, first trimester 03/10/2017    Past Surgical History:  Procedure Laterality Date  . DILATION AND CURETTAGE OF UTERUS     SAB    Prior to Admission medications   Medication Sig Start Date End Date Taking? Authorizing Provider  Prenatal Vit-Fe Fumarate-FA (PRENATAL MULTIVITAMIN) TABS tablet Take 1 tablet by mouth daily at 12 noon.   Yes [provider]  metoCLOPramide (REGLAN) 10 MG tablet Take 1 tablet (10 mg total) by mouth every 8 (eight) hours as needed for up to 3 days for nausea. 04/16/17 04/19/17  Nita Sickle, MD  nitrofurantoin, macrocrystal-monohydrate, (MACROBID) 100 MG capsule Take 1 capsule (100 mg total) by mouth 2 (two) times daily for 5 days. 04/16/17 04/21/17  Nita Sickle, MD    Allergies Penicillins  Family History    Problem Relation Age of Onset  . Cancer Neg Hx   . Diabetes Neg Hx   . Heart disease Neg Hx   . Stroke Neg Hx     Social History Social History   Tobacco Use  . Smoking status: Current Every Day Smoker    Packs/day: 0.25    Types: Cigarettes  . Smokeless tobacco: Never Used  Substance Use Topics  . Alcohol use: No    Frequency: Never  . Drug use: No    Review of Systems  Constitutional: Negative for fever. + Chills, body aches, malaise Eyes: Negative for visual changes. ENT: Negative for sore throat. Neck: No neck pain  Cardiovascular: Negative for chest pain. Respiratory: Negative for shortness of breath. Gastrointestinal: Negative for abdominal pain. + vomiting and diarrhea. Genitourinary: Negative for dysuria. Musculoskeletal: Negative for back pain. Skin: Negative for rash. Neurological: Negative for headaches, weakness or numbness. Psych: No SI or HI  ____________________________________________   PHYSICAL EXAM:  VITAL SIGNS: Vitals:   04/16/17 1935  BP: 112/75  Pulse: 95  Resp: 18  Temp: 98.4 F (36.9 C)  SpO2: 99%   Constitutional: Alert and oriented. Well appearing and in no apparent distress. HEENT:      Head: Normocephalic and atraumatic.         Eyes: Conjunctivae are normal. Sclera is non-icteric.       Mouth/Throat: Mucous membranes are moist.       Neck: Supple with no signs of meningismus. Cardiovascular: Regular rate and rhythm. No murmurs, gallops, or rubs. 2+ symmetrical distal pulses are present in all  extremities. No JVD. Respiratory: Normal respiratory effort. Lungs are clear to auscultation bilaterally. No wheezes, crackles, or rhonchi.  Gastrointestinal: Soft, non tender, and non distended with positive bowel sounds. No rebound or guarding. Genitourinary: No CVA tenderness. Musculoskeletal: Nontender with normal range of motion in all extremities. No edema, cyanosis, or erythema of extremities. Neurologic: Normal speech and  language. Face is symmetric. Moving all extremities. No gross focal neurologic deficits are appreciated. Skin: Skin is warm, dry and intact. No rash noted. Psychiatric: Mood and affect are normal. Speech and behavior are normal.  ____________________________________________   LABS (all labs ordered are listed, but only abnormal results are displayed)  Labs Reviewed  COMPREHENSIVE METABOLIC PANEL - Abnormal; Notable for the following components:      Result Value   CO2 20 (*)    Glucose, Bld 111 (*)    Creatinine, Ser 0.43 (*)    All other components within normal limits  CBC - Abnormal; Notable for the following components:   WBC 13.1 (*)    All other components within normal limits  URINALYSIS, COMPLETE (UACMP) WITH MICROSCOPIC - Abnormal; Notable for the following components:   Color, Urine YELLOW (*)    APPearance HAZY (*)    Bacteria, UA MANY (*)    Squamous Epithelial / LPF 0-5 (*)    All other components within normal limits  LIPASE, BLOOD  INFLUENZA PANEL BY PCR (TYPE A & B)  CBG MONITORING, ED   ____________________________________________  EKG  ED ECG REPORT I, Nita Sickle, the attending physician, personally viewed and interpreted this ECG.  Normal sinus rhythm, rate of 98, normal intervals, normal axis, no ST elevations or depressions. Normal EKG.  ____________________________________________  RADIOLOGY  none  ____________________________________________   PROCEDURES  Procedure(s) performed: yes Procedures   POCUS IUP with good fetal movement and FHR 162  Critical Care performed:  None ____________________________________________   INITIAL IMPRESSION / ASSESSMENT AND PLAN / ED COURSE   37 y.o. female G37P0 at [redacted]weeks GA by US done 03/27/17 who presents for evaluation of flulike symptoms since this am. Patient is well-appearing, in no distress, vitals WNL, lungs are clear, oropharynx is clear, TMs are clear, abdomen is soft and nontender.  Bedside ultrasound showing normal IUP with good fetal movement and normal fetal heart rate. We'll check for flu. We'll check labs to rule out dehydration or electrolyte abnormalities. We'll give IV fluids and Reglan for nausea and vomiting. Differential diagnoses including viral gastroenteritis versus influenza.    _________________________ 9:03 PM on 04/16/2017 ----------------------------------------- Labs show a leukocytosis consistent with a viral gastritis. Flu swab was negative. Labs showing a normal creatinine with no evidence of dehydration. UA with bacteriuria since patient is pregnant I will treat with Macrobid. Patient will be sent home on Macrobid and Reglan, close follow-up with OB/GYN. Discussed return precautions with her and her husband.    As part of my medical decision making, I reviewed the following data within the electronic MEDICAL RECORD NUMBER Nursing notes reviewed and incorporated, Labs reviewed , Notes from prior ED visits and [place] Controlled Substance Database    Pertinent labs & imaging results that were available during my care of the patient were reviewed by me and considered in my medical decision making (see chart for details).    ____________________________________________   FINAL CLINICAL IMPRESSION(S) / ED DIAGNOSES  Final diagnoses:  Nausea vomiting and diarrhea  Viral gastroenteritis  Bacteriuria during pregnancy      NEW MEDICATIONS STARTED DURING THIS VISIT:  ED  Discharge Orders        Ordered    nitrofurantoin, macrocrystal-monohydrate, (MACROBID) 100 MG capsule  2 times daily     04/16/17 2101    metoCLOPramide (REGLAN) 10 MG tablet  Every 8 hours PRN     04/16/17 2101       Note:  This document was prepared using Dragon voice recognition software and may include unintentional dictation errors.    Nita SickleVeronese, [place], MD 04/16/17 2104

## 2017-04-16 NOTE — ED Notes (Signed)
ED Provider at bedside. 

## 2017-04-16 NOTE — ED Notes (Signed)
UNABLE TO HEAR FETAL HEART TONES IN TRIAGE

## 2017-04-18 ENCOUNTER — Encounter: Payer: Self-pay | Admitting: Obstetrics and Gynecology

## 2017-04-24 ENCOUNTER — Ambulatory Visit (INDEPENDENT_AMBULATORY_CARE_PROVIDER_SITE_OTHER): Payer: BLUE CROSS/BLUE SHIELD | Admitting: Obstetrics & Gynecology

## 2017-04-24 ENCOUNTER — Encounter: Payer: Self-pay | Admitting: Obstetrics & Gynecology

## 2017-04-24 VITALS — BP 140/80 | Wt 164.0 lb

## 2017-04-24 DIAGNOSIS — Z3492 Encounter for supervision of normal pregnancy, unspecified, second trimester: Secondary | ICD-10-CM

## 2017-04-24 DIAGNOSIS — O0992 Supervision of high risk pregnancy, unspecified, second trimester: Secondary | ICD-10-CM | POA: Insufficient documentation

## 2017-04-24 DIAGNOSIS — Z3A16 16 weeks gestation of pregnancy: Secondary | ICD-10-CM

## 2017-04-24 NOTE — Patient Instructions (Signed)

## 2017-04-24 NOTE — Progress Notes (Signed)
  Subjective  Fetal Movement? no Contractions? no Leaking Fluid? no Vaginal Bleeding? No Dizziness at times.  Seen in ER, treated for ear infection w ABX.  Better but still occas dizziness.  Objective  BP 140/80   Wt 164 lb (74.4 kg)   LMP 12/28/2016 (Exact Date)   BMI 29.05 kg/m  General: NAD Pumonary: no increased work of breathing Abdomen: gravid, non-tender Extremities: no edema Psychiatric: mood appropriate, affect full  Assessment  37 y.o. G2P0010 at 669w5d by  10/04/2017, by Last Menstrual Period presenting for routine prenatal visit  Plan   Problem List Items Addressed This Visit      Other   Encounter for supervision of low-risk pregnancy in second trimester   Relevant Orders   US OB Comp + 14 Wk    Other Visit Diagnoses    [redacted] weeks gestation of pregnancy    -  Primary   Relevant Orders   US OB Comp + 14 Wk    Monitor dizziness.  ENT if persists Monitor BPs.  Unlikely preclampsia at this stage.  118/80 on recheck today  Annamarie MajorPaul Konstantin Lehnen, MD, Merlinda FrederickFACOG [facility], [facility] 04/24/2017  9:11 AM

## 2017-05-22 ENCOUNTER — Ambulatory Visit (INDEPENDENT_AMBULATORY_CARE_PROVIDER_SITE_OTHER): Payer: BLUE CROSS/BLUE SHIELD

## 2017-05-22 ENCOUNTER — Ambulatory Visit (INDEPENDENT_AMBULATORY_CARE_PROVIDER_SITE_OTHER): Payer: BLUE CROSS/BLUE SHIELD | Admitting: Obstetrics & Gynecology

## 2017-05-22 VITALS — BP 120/80 | Wt 172.0 lb

## 2017-05-22 DIAGNOSIS — Z3A16 16 weeks gestation of pregnancy: Secondary | ICD-10-CM

## 2017-05-22 DIAGNOSIS — Z3A20 20 weeks gestation of pregnancy: Secondary | ICD-10-CM

## 2017-05-22 DIAGNOSIS — Z3492 Encounter for supervision of normal pregnancy, unspecified, second trimester: Secondary | ICD-10-CM

## 2017-05-22 DIAGNOSIS — O09521 Supervision of elderly multigravida, first trimester: Secondary | ICD-10-CM

## 2017-05-22 NOTE — Progress Notes (Signed)
  Subjective  Fetal Movement? yes Contractions? no Leaking Fluid? no Vaginal Bleeding? no  Objective  BP 120/80   Wt 172 lb (78 kg)   LMP 12/28/2016 (Exact Date)   BMI 30.47 kg/m  General: NAD Pumonary: no increased work of breathing Abdomen: gravid, non-tender Extremities: no edema Psychiatric: mood appropriate, affect full  Assessment  37 y.o. G2P0010 at 838w5d by  10/04/2017, by Last Menstrual Period presenting for routine prenatal visit  Plan   Problem List Items Addressed This Visit      Other   Advanced maternal age in multigravida, first trimester   Encounter for supervision of low-risk pregnancy in second trimester    Other Visit Diagnoses    [redacted] weeks gestation of pregnancy    -  Primary    Review of ULTRASOUND. I have personally reviewed images and [facility] There is a singleton gestation with subjectively normal amniotic fluid volume. The fetal biometry correlates with established dating. Detailed evaluation of the fetal anatomy was performed.The fetal anatomical survey appears within normal limits within the resolution of ultrasound as described above.  It must be noted that a normal ultrasound is unable to rule out fetal aneuploidy.    Bottle feeding plans Considering IUD, ibfo gv  Annamarie MajorPaul Harris, MD, Merlinda FrederickFACOG [facility], [facility] 05/22/2017  9:47 AM

## 2017-06-19 ENCOUNTER — Ambulatory Visit (INDEPENDENT_AMBULATORY_CARE_PROVIDER_SITE_OTHER): Payer: BLUE CROSS/BLUE SHIELD | Admitting: Maternal Newborn

## 2017-06-19 ENCOUNTER — Encounter: Payer: Self-pay | Admitting: Maternal Newborn

## 2017-06-19 VITALS — BP 140/78 | Wt 177.0 lb

## 2017-06-19 DIAGNOSIS — O0992 Supervision of high risk pregnancy, unspecified, second trimester: Secondary | ICD-10-CM

## 2017-06-19 DIAGNOSIS — Z3A24 24 weeks gestation of pregnancy: Secondary | ICD-10-CM

## 2017-06-19 NOTE — Progress Notes (Signed)
C/o swelling, left leg swollen, Bps have been fine at work.rj

## 2017-06-19 NOTE — Progress Notes (Signed)
    Routine Prenatal Care Visit  Subjective  Catherine Perez is a 37 y.o. G2P0010 at 3774w5d being seen today for ongoing prenatal care.  She is currently monitored for the following issues for this high-risk pregnancy and has Advanced maternal age in multigravida, first trimester and Supervision of high risk pregnancy, antepartum, second trimester on their problem list.  ----------------------------------------------------------------------------------- Patient reports edema in BLE, worse on left side. Relieved when she rests and worse when working/standing..   Contractions: Not present. Vag. Bleeding: None.  Movement: Present. Denies leaking of fluid.  ----------------------------------------------------------------------------------- The following portions of the patient's history were reviewed and updated as appropriate: allergies, current medications, past family history, past medical history, past social history, past surgical history and problem list. Problem list updated.   Objective   Vitals:   06/19/17 0931  BP: 140/78   Last menstrual period 12/28/2016. Pregravid weight 155 lb (70.3 kg) Total Weight Gain 22 lb (9.979 kg) Urinalysis: Urine Protein: Negative Urine Glucose: Negative  Fetal Status: Fetal Heart Rate (bpm): 148 Fundal Height: 26 cm Movement: Present     General:  Alert, oriented and cooperative. Patient is in no acute distress.  Skin: Skin is warm and dry. No rash noted.   Cardiovascular: Normal heart rate noted  Respiratory: Normal respiratory effort, no problems with respiration noted  Abdomen: Soft, gravid, appropriate for gestational age. Pain/Pressure: Absent     Pelvic:  Cervical exam deferred        Extremities: Normal range of motion.  Edema: Trace  Mental Status: Normal mood and affect. Normal behavior. Normal judgment and thought content.     Assessment   37 y.o. G2P0010 at 7574w5d, EDD 10/04/2017 by Last Menstrual Period presenting for routine  prenatal visit.  Plan   pregnancy #2 Problems (from 03/10/17 to present)    Problem Noted Resolved   Pregnancy, supervision, high-risk, first trimester 03/10/2017 by Natale MilchSchuman, Christanna R, MD No   Overview Signed 03/10/2017 10:50 AM by Natale MilchSchuman, Christanna R, MD      [facility]inic [facility] Prenatal Labs  Dating  Blood type:     Genetic Screen 1 Screen:     AFP:      Quad:      NIPS:    Antibody:   Anatomic US  Rubella:   Varicella: @VZVIGG @  GTT Early:        28 wk:      RPR:     Rhogam  HBsAg:     TDaP vaccine                       HIV:     Flu Shot   Declines                             GBS:   Contraception  Pap:  CBB     CS/VBAC    Baby Food    Support Person             Advanced maternal age in multigravida, first trimester 03/10/2017 by Natale MilchSchuman, Christanna R, MD No   Overview Signed 03/10/2017 10:49 AM by Natale MilchSchuman, Christanna R, MD    [ ]  NIPT offered          Gestational age appropriate obstetric precautions were reviewed.  Return in about 1 month (around 07/17/2017) for ROB with GTT/28 week labs.  Marcelyn BruinsJacelyn Tammee Thielke, CNM 06/19/2017  10:47 AM

## 2017-07-17 ENCOUNTER — Ambulatory Visit (INDEPENDENT_AMBULATORY_CARE_PROVIDER_SITE_OTHER): Payer: Managed Care, Other (non HMO) | Admitting: Obstetrics and Gynecology

## 2017-07-17 ENCOUNTER — Other Ambulatory Visit: Payer: Managed Care, Other (non HMO)

## 2017-07-17 ENCOUNTER — Encounter: Payer: Self-pay | Admitting: Obstetrics and Gynecology

## 2017-07-17 VITALS — BP 136/60 | Wt 181.0 lb

## 2017-07-17 DIAGNOSIS — O0992 Supervision of high risk pregnancy, unspecified, second trimester: Secondary | ICD-10-CM

## 2017-07-17 DIAGNOSIS — O09521 Supervision of elderly multigravida, first trimester: Secondary | ICD-10-CM

## 2017-07-17 DIAGNOSIS — Z3A28 28 weeks gestation of pregnancy: Secondary | ICD-10-CM

## 2017-07-17 NOTE — Progress Notes (Signed)
Routine Prenatal Care Visit  Subjective  Catherine Perez is a 37 y.o. G2P0010 at [redacted]w[redacted]d being seen today for ongoing prenatal care.  She is currently monitored for the following issues for this high-risk pregnancy and has Advanced maternal age in multigravida, first trimester and Supervision of high risk pregnancy, antepartum, second trimester on their problem list.  ----------------------------------------------------------------------------------- Patient reports no complaints.   Contractions: Not present. Vag. Bleeding: None.  Movement: Present. Denies leaking of fluid.  ----------------------------------------------------------------------------------- The following portions of the patient's history were reviewed and updated as appropriate: allergies, current medications, past family history, past medical history, past social history, past surgical history and problem list. Problem list updated.   Objective  Blood pressure 136/60, weight 181 lb (82.1 kg), last menstrual period 12/28/2016. Pregravid weight 155 lb (70.3 kg) Total Weight Gain 26 lb (11.8 kg) Urinalysis: Urine Protein: Trace Urine Glucose: Negative  Fetal Status: Fetal Heart Rate (bpm): 137 Fundal Height: 28 cm Movement: Present     General:  Alert, oriented and cooperative. Patient is in no acute distress.  Skin: Skin is warm and dry. No rash noted.   Cardiovascular: Normal heart rate noted  Respiratory: Normal respiratory effort, no problems with respiration noted  Abdomen: Soft, gravid, appropriate for gestational age. Pain/Pressure: Absent     Pelvic:  Cervical exam deferred        Extremities: Normal range of motion.  Edema: Trace  ental Status: Normal mood and affect. Normal behavior. Normal judgment and thought content.     Assessment   37 y.o. G2P0010 at [redacted]w[redacted]d by  10/04/2017, by Last Menstrual Period presenting for routine prenatal visit  Plan   pregnancy #2 Problems (from 03/10/17 to present)    Problem Noted Resolved   Supervision of high risk pregnancy, antepartum, second trimester 04/24/2017 by Nadara Mustard, MD No   Overview Addendum 07/17/2017  9:27 AM by Natale Milch, MD    [facility] Prenatal Labs  Dating  LMP = 12wk [place] Blood type: A/Positive/-- (01/04 1118)   Genetic Screen 1 Screen:    AFP:     Quad:     NIPS: Declined genetic screening Antibody:Negative (01/04 1118)  Anatomic [place]  complete- gender is surprise Rubella: 18.00 (01/04 1118) Varicella: Immune  GTT Early:               Third trimester:  RPR: Non Reactive (01/04 1118)   Rhogam  not indicated HBsAg: Negative (01/04 1118)   TDaP vaccine                        Flu Shot: HIV: Non Reactive (01/04 1118)   Baby Food  Bottle                              GBS:   Contraception  IUD Pap: 2019 NIL HPV negative  CBB   Given information   CS/VBAC  not applicable   Support Person            Advanced maternal age in multigravida, first trimester 03/10/2017 by Natale Milch, MD No   Overview Signed 03/10/2017 10:49 AM by Natale Milch, MD     NIPT offered        Gestational age appropriate obstetric precautions including but not limited to vaginal bleeding, contractions, leaking of fluid and fetal movement were reviewed in detail with the patient.  28 week labs today Given information on prenatal classes with [facility]. She has not yet registered for classes.  Given information on birthcontrol options postpartum. Given pamphlets about the different IUDs and Nexplanon. Given information on cord blood banking. Discussed goal weight gain of 25-30lbs for the pregnancy.  Return in about 2 weeks (around 07/31/2017) for ROB.  Adelene Idler MD [facility], [facility] 07/17/17 9:27 AM

## 2017-07-17 NOTE — Progress Notes (Signed)
ROB 28 Week GTT

## 2017-07-18 LAB — 28 WEEK RH+PANEL
Basophils Absolute: 0 10*3/uL (ref 0.0–0.2)
Basos: 0 %
EOS (ABSOLUTE): 0.1 10*3/uL (ref 0.0–0.4)
Eos: 1 %
Gestational Diabetes Screen: 185 mg/dL — ABNORMAL HIGH (ref 65–139)
HEMOGLOBIN: 10.8 g/dL — AB (ref 11.1–15.9)
HIV SCREEN 4TH GENERATION: NONREACTIVE
Hematocrit: 31.8 % — ABNORMAL LOW (ref 34.0–46.6)
IMMATURE GRANS (ABS): 0.1 10*3/uL (ref 0.0–0.1)
IMMATURE GRANULOCYTES: 1 %
LYMPHS: 12 %
Lymphocytes Absolute: 1.7 10*3/uL (ref 0.7–3.1)
MCH: 29.5 pg (ref 26.6–33.0)
MCHC: 34 g/dL (ref 31.5–35.7)
MCV: 87 fL (ref 79–97)
MONOCYTES: 7 %
MONOS ABS: 0.9 10*3/uL (ref 0.1–0.9)
NEUTROS PCT: 79 %
Neutrophils Absolute: 10.8 10*3/uL — ABNORMAL HIGH (ref 1.4–7.0)
Platelets: 372 10*3/uL (ref 150–379)
RBC: 3.66 x10E6/uL — ABNORMAL LOW (ref 3.77–5.28)
RDW: 14.5 % (ref 12.3–15.4)
RPR Ser Ql: NONREACTIVE
WBC: 13.7 10*3/uL — ABNORMAL HIGH (ref 3.4–10.8)

## 2017-07-20 ENCOUNTER — Encounter: Payer: Self-pay | Admitting: Maternal Newborn

## 2017-07-20 ENCOUNTER — Other Ambulatory Visit: Payer: Self-pay | Admitting: Maternal Newborn

## 2017-07-20 ENCOUNTER — Encounter: Payer: Self-pay | Admitting: Obstetrics and Gynecology

## 2017-07-20 DIAGNOSIS — O99810 Abnormal glucose complicating pregnancy: Secondary | ICD-10-CM | POA: Insufficient documentation

## 2017-07-20 DIAGNOSIS — O99013 Anemia complicating pregnancy, third trimester: Secondary | ICD-10-CM | POA: Insufficient documentation

## 2017-07-20 MED ORDER — FERROUS SULFATE 325 (65 FE) MG PO TABS: 325.0000 mg | ORAL_TABLET | Freq: Two times a day (BID) | ORAL | 1 refills | Status: DC

## 2017-07-20 NOTE — Progress Notes (Signed)
Patient aware to schedule 3 hour GTT. Iron supplement Rx sent to pharmacy.

## 2017-07-27 ENCOUNTER — Other Ambulatory Visit: Payer: Managed Care, Other (non HMO)

## 2017-07-27 DIAGNOSIS — O99810 Abnormal glucose complicating pregnancy: Secondary | ICD-10-CM

## 2017-07-28 LAB — GESTATIONAL GLUCOSE TOLERANCE
GLUCOSE 1 HOUR GTT: 241 mg/dL — AB (ref 65–179)
GLUCOSE 2 HOUR GTT: 197 mg/dL — AB (ref 65–154)
GLUCOSE FASTING: 98 mg/dL — AB (ref 65–94)
Glucose, GTT - 3 Hour: 77 mg/dL (ref 65–139)

## 2017-08-01 ENCOUNTER — Ambulatory Visit (INDEPENDENT_AMBULATORY_CARE_PROVIDER_SITE_OTHER): Payer: BLUE CROSS/BLUE SHIELD | Admitting: Obstetrics and Gynecology

## 2017-08-01 ENCOUNTER — Encounter: Payer: Self-pay | Admitting: Obstetrics and Gynecology

## 2017-08-01 VITALS — BP 128/70 | Wt 180.0 lb

## 2017-08-01 DIAGNOSIS — O09521 Supervision of elderly multigravida, first trimester: Secondary | ICD-10-CM

## 2017-08-01 DIAGNOSIS — O99013 Anemia complicating pregnancy, third trimester: Secondary | ICD-10-CM

## 2017-08-01 DIAGNOSIS — O24410 Gestational diabetes mellitus in pregnancy, diet controlled: Secondary | ICD-10-CM

## 2017-08-01 DIAGNOSIS — O0992 Supervision of high risk pregnancy, unspecified, second trimester: Secondary | ICD-10-CM

## 2017-08-01 DIAGNOSIS — J40 Bronchitis, not specified as acute or chronic: Secondary | ICD-10-CM

## 2017-08-01 DIAGNOSIS — Z23 Encounter for immunization: Secondary | ICD-10-CM

## 2017-08-01 MED ORDER — AZITHROMYCIN 250 MG PO TABS: ORAL_TABLET | ORAL | 1 refills | Status: DC

## 2017-08-01 MED ORDER — TETANUS-DIPHTH-ACELL PERTUSSIS 5-2.5-18.5 LF-MCG/0.5 IM SUSP
0.5000 mL | Freq: Once | INTRAMUSCULAR | Status: AC
  Administered 2017-08-01: 0.5 mL via INTRAMUSCULAR

## 2017-08-01 MED ORDER — ACCU-CHEK FASTCLIX LANCETS MISC: 1.0000 | Freq: Four times a day (QID) | 12 refills | Status: DC

## 2017-08-01 MED ORDER — GLUCOSE BLOOD VI STRP: ORAL_STRIP | 12 refills | Status: DC

## 2017-08-01 NOTE — Progress Notes (Signed)
Routine Prenatal Care Visit  Subjective  Catherine Perez is a 37 y.o. G2P0010 at [redacted]w[redacted]d being seen today for ongoing prenatal care.  She is currently monitored for the following issues for this high-risk pregnancy and has Advanced maternal age in multigravida, first trimester; Supervision of high risk pregnancy, antepartum, second trimester; Pregnancy with abnormal glucose tolerance test (GTT); and Anemia affecting pregnancy in third trimester on their problem list.  ----------------------------------------------------------------------------------- Patient reports no complaints.   Contractions: Not present. Vag. Bleeding: None.  Movement: Present. Denies leaking of fluid.  ----------------------------------------------------------------------------------- The following portions of the patient's history were reviewed and updated as appropriate: allergies, current medications, past family history, past medical history, past social history, past surgical history and problem list. Problem list updated.   Objective  Blood pressure 128/70, weight 180 lb (81.6 kg), last menstrual period 12/28/2016. Pregravid weight 155 lb (70.3 kg) Total Weight Gain 25 lb (11.3 kg) Urinalysis:      Fetal Status: Fetal Heart Rate (bpm): 145 Fundal Height: 31 cm Movement: Present     General:  Alert, oriented and cooperative. Patient is in no acute distress.  Skin: Skin is warm and dry. No rash noted.   Cardiovascular: Normal heart rate noted  Respiratory: Normal respiratory effort, no problems with respiration noted  Abdomen: Soft, gravid, appropriate for gestational age. Pain/Pressure: Absent     Pelvic:  Cervical exam deferred        Extremities: Normal range of motion.  Edema: Mild pitting, slight indentation  ental Status: Normal mood and affect. Normal behavior. Normal judgment and thought content.     Assessment   37 y.o. G2P0010 at [redacted]w[redacted]d by  10/04/2017, by Last Menstrual Period presenting for  routine prenatal visit  Plan   pregnancy #2 Problems (from 03/10/17 to present)    Problem Noted Resolved   Supervision of high risk pregnancy, antepartum, second trimester 04/24/2017 by Nadara Mustard, MD No   Overview Addendum 07/17/2017  9:27 AM by Natale Milch, MD    [facility] Prenatal Labs  Dating  LMP = 12wk [place] Blood type: A/Positive/-- (01/04 1118)   Genetic Screen 1 Screen:    AFP:     Quad:     NIPS: Declined genetic screening Antibody:Negative (01/04 1118)  Anatomic [place]  complete- gender is surprise Rubella: 18.00 (01/04 1118) Varicella: Immune  GTT Early:               Third trimester:  RPR: Non Reactive (01/04 1118)   Rhogam  not indicated HBsAg: Negative (01/04 1118)   TDaP vaccine                        Flu Shot: HIV: Non Reactive (01/04 1118)   Baby Food  Bottle                              GBS:   Contraception  IUD Pap: 2019 NIL HPV negative  CBB   Given information   CS/VBAC  not applicable   Support Person            Advanced maternal age in multigravida, first trimester 03/10/2017 by Natale Milch, MD No   Overview Signed 03/10/2017 10:49 AM by Natale Milch, MD     NIPT offered      Pregnancy, supervision, high-risk, first trimester 03/10/2017 by Natale Milch, MD 06/19/2017 by Oswaldo Conroy,  CNM   Overview Signed 03/10/2017 10:50 AM by Natale Milch, MD      [facility]inic [facility] Prenatal Labs  Dating  Blood type:     Genetic Screen 1 Screen:     AFP:      Quad:      NIPS:    Antibody:   Anatomic [place]  Rubella:   Varicella: @  GTT Early:        28 wk:      RPR:     Rhogam  HBsAg:     TDaP vaccine                       HIV:     Flu Shot   Declines                             GBS:   Contraception  Pap:  CBB     CS/VBAC    Baby Food    Support Person                 Gestational age appropriate obstetric precautions including but not limited to vaginal bleeding, contractions, leaking of fluid and  fetal movement were reviewed in detail with the patient.    Gestational diabetes- will send patient to diabetic teaching, given meter and glucose log book in office as well as reading material about gestational diabetes. Follow up in 1 week to discuss blood sugar results. URI with productive cough for 1 week. Will send z-pak.  Return in about 1 week (around 08/08/2017) for ROB.  Adelene Idler MD [facility], [facility] 08/01/17 8:34 AM

## 2017-08-01 NOTE — Patient Instructions (Signed)
Gestational Diabetes  Diabetes mellitus (also called "diabetes") is a condition in which too much glucose (sugar) stays in the blood instead of being used for energy. Health problems can occur when blood sugar is too high. Some women develop diabetes for the first time during pregnancy. This condition is called gestational diabetes (GD). Women with GD need special care both during and after pregnancy.  This pamphlet explains how GD develops  risk factors  how GD affects a woman  how GD affects a baby  testing and management  delivery and care after pregnancy How Gestational Diabetes Develops The body produces a hormone called insulin that keeps blood sugar levels in the normal range. During pregnancy, higher levels of pregnancy hormones can interfere with insulin. Usually the body can make more insulin during pregnancy to keep blood sugar normal. But in some women, the body cannot make enough insulin during pregnancy, and blood sugar goes up. This leads to GD. GD goes away after childbirth, but women who have had GD are at higher risk of developing diabetes later in life. Some women who develop GD may have had mild diabetes before pregnancy and not known it. For these women, diabetes does not go away after pregnancy and may be a lifelong condition. Risk Factors Several risk factors are linked to GD. It also can occur in women who have no risk factors, but it is more likely in women who are overweight or obese  are physically inactive  had GD in a previous pregnancy  had a very large baby (9 pounds or more) in a previous pregnancy  have high blood pressure  have a history of heart disease  have polycystic ovary syndrome (PCOS)  are of [place] American, [place] American, Hispanic, Native American, or [place] background How Gestational Diabetes Affects a Woman When a woman has GD, her body passes more sugar to her fetus than it needs. With too much sugar, her fetus can gain a lot of weight.  A large fetus (weighing 9 pounds or more) can lead to complications for the woman, including labor difficulties, cesarean delivery, and heavy bleeding after delivery. In women who give birth vaginally, delivery of a large fetus can cause severe tears in the vagina and the area between the vagina and the anus. Diabetes During Pregnancy    When a woman has gestational diabetes, she also may have other conditions that can cause problems during pregnancy. For example, high blood pressure is more common in women with GD. High blood pressure during pregnancy can place extra stress on the heart and kidneys. Preeclampsia also is more common in women with GD. Preeclampsia is a condition that occurs during pregnancy or right after childbirth. A woman has preeclampsia when she has high blood pressure and other signs that her organs are not working normally. If preeclampsia occurs during pregnancy, the fetus may need to be delivered right away, even if it is not fully grown. Women who have high blood pressure or preeclampsia during pregnancy are at greater risk of heart disease and stroke later in life. If you had high blood pressure or preeclampsia during a past pregnancy, tell your obstetrician-gynecologist (ob-gyn) or other health care professional so the health of your heart and blood vessels can be monitored throughout your life. How Gestational Diabetes Affects a Baby Babies born to women with GD may have problems with breathing and jaundice. These babies may have low blood sugar at birth. They are at increased risk of obesity as children and  increased risk of diabetes as adults. Large babies are more likely to experience birth trauma, including damage to their shoulders, during vaginal delivery. Large babies may need special care in a neonatal intensive care unit (NICU). There also is an increased risk of stillbirth with GD. Testing for Gestational Diabetes All pregnant women should be screened for GD. Your  ob-gyn or other health care professional will ask about your medical history to determine whether you have risk factors for GD. If you have risk factors, your blood sugar will be tested early in pregnancy. If you do not have risk factors or your testing does not show you have GD early in pregnancy, your blood sugar will be measured between 24 weeks and 28 weeks of pregnancy. Managing Gestational Diabetes If you have GD, you will need more frequent prenatal care visits to monitor your health and your fetus's health. You will need to track your blood sugar and do things to keep it under control. Doing so will reduce the risks to both you and your fetus. For many women, a healthy diet and regular exercise will control blood sugar. Some women may need medications to help reach normal blood sugar levels even with diet changes and exercise. Women may see a diabetes educator or a Data processing manager. A diabetes educator is a health care professional who helps women develop a plan to stay healthy and gives them the tools to manage their diabetes. A dietitian is an expert in nutrition, meal planning, and helping women choose healthy food options. Later in pregnancy, special tests of the fetus's well-being may be done. You are more likely to have these tests if your GD is not controlled, if you need to take medications to help control your blood sugar levels, or if you develop health problems during pregnancy. Tracking Blood Sugar Levels A glucose meter is used to test blood sugar levels. This device measures your blood sugar from a small drop of blood. Checking your blood sugar is an important tool for managing GD. For the best results, follow the schedule your ob-gyn or other health care professional gives you. Keep a record of your blood sugar levels and bring it with you to each prenatal visit. Blood sugar logs also can be kept online, stored in phone apps, and emailed to your ob-gyn or other health care professional. Your  blood sugar log will help your ob-gyn or other health care professional provide the best care during your pregnancy. Healthy Eating A healthy diet is a key part of any pregnancy. Your fetus depends on the food you eat for its growth and nourishment. When women have GD, making healthy food choices is even more important to keep blood sugar from getting too high. A dietitian can help you make sure you are getting the recommended amounts of nutrients while controlling your blood sugar. If you have GD, you should eat regular meals throughout the day. You may need to eat small snacks as well, especially at night. Eating regularly helps avoid dips and spikes in your blood sugar level. Often, three meals and two to three snacks per day are recommended. Carbohydrates are an important part of a healthy diet. There are two types: 1) simple carbohydrates and 2) complex carbohydrates. Simple carbohydrates provide a quick energy boost because they are digested and absorbed rapidly. They are found in naturally sweet foods like fruits as well as honey, maple syrup, sugary drinks, and foods with added sugar.  Complex carbohydrates have dietary fiber and starches. It takes  your body longer to process them, so complex carbohydrates provide longer-lasting energy than simple carbohydrates. Complex carbohydrates are found in whole wheat bread and pasta, brown rice, some fruits, and starchy vegetables such as potatoes and corn. If you have GD, focus on the type of carbohydrates in your meals. Complex carbohydrates are a better choice than simple carbohydrates. Carbohydrates should make up around 40% of your total calories. Protein (20%) and fat (40%) should make up the rest. The number of calories needed daily during pregnancy depends on your prepregnancy weight, stage of pregnancy, and level of activity. It is important to gain a healthy amount of weight during pregnancy. Talk with your ob-gyn or other health care professional  about how much weight gain is best for your pregnancy. For a woman with GD, too much weight gained or weight gained too quickly can make it harder to keep blood sugar levels under control. Exercise Exercise helps keep blood sugar levels in the normal range. You and your ob-gyn or other health care professional can decide how much and what type of exercise is best for you. In general, 30 minutes of moderate-intensity aerobic exercise at least 5 days a week is recommended (or a minimum of 150 minutes per week). An aerobic activity is one in which you move large muscles of the body (like those in the legs and arms) in a rhythmic way. Moderate intensity means you are moving enough to raise your heart rate and start sweating. You still can talk normally, but you cannot sing during moderate-intensity exercise. Brisk walking is an example of this. If you have never exercised before, talk with your ob-gyn or other health care professional before beginning an exercise program.  Walking is a great exercise for all pregnant women. In addition to weekly aerobic exercise, it's a good idea to add a walk for 10-15 minutes after each meal. This can lead to better blood sugar control. Medications For some women, medications may be needed to manage GD. Insulin is the recommended medication during pregnancy to help women control their blood sugar. Insulin does not cross the placenta, so it doesn't affect the fetus. Your ob-gyn or diabetes educator will teach you how to give yourself insulin shots with a small needle. In some cases, your ob-gyn or other health care professional may prescribe a different medication to take by mouth. If you are prescribed medication, you will continue monitoring your blood sugar levels as recommended. Your ob-gyn or other health care professional will review your glucose log to make sure that the medication is working. Changes to your medication may be needed throughout your pregnancy to help keep  your blood sugar in the normal range. Special Tests When a woman has GD, she may need special tests to check the well-being of the fetus. These tests may help your ob-gyn or other health care professional detect possible problems and take steps to manage them. These tests may include the following: Fetal movement counting ("kick counts")-This is a record of how often you feel the fetus move. A healthy fetus tends to move the same amount each day. You may be asked to keep track of this movement late in pregnancy. You should contact your ob-gyn or other health care professional if you feel a difference in your fetus's activity.  Nonstress test-This test measures changes in the fetus's heart rate when the fetus moves. The term "nonstress" means that nothing is done to place stress on the fetus. A belt with a sensor is placed  around your abdomen, and a machine records the fetal heart rate picked up by the sensor.  Biophysical profile (BPP)-This test includes monitoring the fetal heart rate (the same way it is done in a nonstress test) and an ultrasound exam. The BPP checks the fetus's heart rate and estimates the amount of amniotic fluid. The fetus's breathing, movement, and muscle tone also are checked. A modified BPP checks only the fetal heart rate and amniotic fluid level. Nonstress Test    Delivery Most women with controlled GD can complete a full-term pregnancy. But if there are complications with your health or your fetus's health, labor may be induced (started by drugs or other means) before the due date. Although most women with GD can have a vaginal birth, they are more likely to have a cesarean delivery than women without GD. If your ob-gyn or other health care professional thinks your fetus is too big for a safe vaginal delivery, you may discuss the benefits and risks of a scheduled cesarean delivery. Care After Pregnancy GD greatly increases your risk of developing diabetes in your next  pregnancy and in the future when you are no longer pregnant. One third of women who had GD will have diabetes or a milder form of elevated blood sugar soon after giving birth. Between 15% and 70% of women with GD will develop diabetes later in life. If you had GD, you should have a test 4-12 weeks after you give birth. If your blood sugar is normal, you will need to be tested for diabetes every 1-3 years. GD also increases your risk of future heart disease. If you had GD in a past pregnancy, let your ob-gyn or other health care professional know so your heart health can be monitored. Eating a healthy diet, limiting alcohol, staying at a healthy weight, not smoking, and getting daily exercise can help you maintain heart health. A heart-healthy diet stresses vegetables, fruits, beans, and low-fat dairy products  includes fish and poultry  limits red meat, sodium, and sugary foods and drinks Children of women who had GD may be at risk of becoming overweight or obese during childhood. These children also have a higher risk of developing diabetes. Be sure to tell your baby's doctor that you had GD so your baby can be monitored. As your baby grows, his or her blood sugar levels should be checked throughout childhood. Finally. GD can increase the risk of problems during pregnancy. Blood sugar control, a healthy diet, exercise, and medication (if needed) can lower these risks. Women with GD will need follow-up tests for diabetes beginning 4-12 weeks after giving birth and then every 1-3 years. You can improve your future health by losing your pregnancy weight, eating healthy foods, and getting regular exercise. These efforts will decrease your risk of getting diabetes in the future. Glossary Amniotic Fluid: Water in the sac surrounding the fetus in the mother's uterus.  Cesarean Delivery: Delivery of a baby through surgical incisions made in the woman's abdomen and uterus. Diabetes Mellitus: A condition in which  the levels of sugar in the blood are too high. Fetus: The stage of prenatal development that starts 8 weeks after fertilization and lasts until the end of pregnancy. Gestational Diabetes (GD): Diabetes that arises during pregnancy.  Glucose: A sugar that is present in the blood and is the body's main source of fuel. Hormone: A substance made in the body by cells or organs that controls the function of cells or organs. An example is estrogen,  which controls the function of female reproductive organs. Insulin: A hormone that lowers the levels of glucose (sugar) in the blood. Jaundice: A buildup of bilirubin that causes a yellowish appearance. Neonatal Intensive Care Unit (NICU): A specialized area of a hospital in which ill newborns receive complex medical care. Obstetrician-Gynecologist (Ob-Gyn): A physician with special skills, training, and education in women's health. Placenta: Tissue that provides nourishment to and takes waste away from the fetus. Polycystic Ovary Syndrome (PCOS): A condition characterized by two of the following three features: the presence of many small fluid-filled sacs in the ovaries, irregular menstrual periods, and an increase in the levels of certain hormones. Preeclampsia: A disorder that can occur during pregnancy or after childbirth in which there is high blood pressure and other signs of organ injury, such as an abnormal amount of protein in the urine, a low number of platelets, abnormal kidney or liver function, pain over the upper abdomen, fluid in the lungs, or a severe headache or changes in vision. Stillbirth: Delivery of a dead baby. Ultrasound Exam: A test in which sound waves are used to examine internal structures. During pregnancy, it can be used to examine the fetus.

## 2017-08-03 ENCOUNTER — Encounter: Payer: Self-pay | Admitting: Obstetrics and Gynecology

## 2017-08-07 ENCOUNTER — Encounter: Payer: Self-pay | Admitting: Obstetrics and Gynecology

## 2017-08-07 ENCOUNTER — Ambulatory Visit (INDEPENDENT_AMBULATORY_CARE_PROVIDER_SITE_OTHER): Payer: Managed Care, Other (non HMO) | Admitting: Obstetrics and Gynecology

## 2017-08-07 ENCOUNTER — Encounter: Payer: BLUE CROSS/BLUE SHIELD | Attending: Physician Assistant | Admitting: *Deleted

## 2017-08-07 ENCOUNTER — Encounter: Payer: Self-pay | Admitting: *Deleted

## 2017-08-07 VITALS — BP 120/60 | Wt 179.0 lb

## 2017-08-07 VITALS — BP 112/64 | Ht 63.0 in | Wt 180.1 lb

## 2017-08-07 DIAGNOSIS — O24414 Gestational diabetes mellitus in pregnancy, insulin controlled: Secondary | ICD-10-CM

## 2017-08-07 DIAGNOSIS — O24410 Gestational diabetes mellitus in pregnancy, diet controlled: Secondary | ICD-10-CM | POA: Diagnosis present

## 2017-08-07 DIAGNOSIS — Z713 Dietary counseling and surveillance: Secondary | ICD-10-CM | POA: Insufficient documentation

## 2017-08-07 DIAGNOSIS — O09521 Supervision of elderly multigravida, first trimester: Secondary | ICD-10-CM

## 2017-08-07 DIAGNOSIS — O0992 Supervision of high risk pregnancy, unspecified, second trimester: Secondary | ICD-10-CM

## 2017-08-07 DIAGNOSIS — O99013 Anemia complicating pregnancy, third trimester: Secondary | ICD-10-CM

## 2017-08-07 DIAGNOSIS — O99810 Abnormal glucose complicating pregnancy: Secondary | ICD-10-CM

## 2017-08-07 DIAGNOSIS — Z3A31 31 weeks gestation of pregnancy: Secondary | ICD-10-CM

## 2017-08-07 MED ORDER — INSULIN ASPART 100 UNIT/ML FLEXPEN: 5.0000 [IU] | PEN_INJECTOR | SUBCUTANEOUS | 11 refills | Status: DC

## 2017-08-07 MED ORDER — INSULIN GLARGINE 100 UNITS/ML SOLOSTAR PEN: 10.0000 [IU] | PEN_INJECTOR | Freq: Every day | SUBCUTANEOUS | 11 refills | Status: DC

## 2017-08-07 NOTE — Progress Notes (Signed)
Pt c/o still having cold sxs. Reports good blood sugars.

## 2017-08-07 NOTE — Patient Instructions (Addendum)
Read booklet on Gestational Diabetes Follow Gestational Meal Planning Guidelines Don't skip meals Limit desserts and sweets Complete a 3 Day Food Record and bring to next appointment Check blood sugars 4 x day - before breakfast and 2 hrs after every meal and record  Bring blood sugar log to all appointments Purchase urine ketone strips if blood sugars not controlled and check urine ketones every am:  If + increase bedtime snack to 1 protein and 2 carbohydrate servings Walk 20-30 minutes at least 5 x week if permitted by MD Carry fast acting glucose and a snack at all times Rotate injection sites Inject 5 units NovoLog before supper - eat within 15 minutes Inject 10 units Lantus at bedtime

## 2017-08-07 NOTE — Progress Notes (Signed)
Routine Prenatal Care Visit  Subjective  Catherine Perez is a 37 y.o. G2P0010 at [redacted]w[redacted]d being seen today for ongoing prenatal care.  She is currently monitored for the following issues for this high-risk pregnancy and has Advanced maternal age in multigravida, first trimester; Supervision of high risk pregnancy, antepartum, second trimester; Pregnancy with abnormal glucose tolerance test (GTT); and Anemia affecting pregnancy in third trimester on their problem list.  ----------------------------------------------------------------------------------- Patient reports no complaints.  Reports her cold symptoms are improving. Contractions: Not present. Vag. Bleeding: None.  Movement: Present. Denies leaking of fluid.  ----------------------------------------------------------------------------------- The following portions of the patient's history were reviewed and updated as appropriate: allergies, current medications, past family history, past medical history, past social history, past surgical history and problem list. Problem list updated.   Objective  Blood pressure 120/60, weight 179 lb (81.2 kg), last menstrual period 12/28/2016. Pregravid weight 155 lb (70.3 kg) Total Weight Gain 24 lb (10.9 kg) Urinalysis: Urine Protein: Negative Urine Glucose: Negative  Fetal Status: Fetal Heart Rate (bpm): 140 Fundal Height: 32 cm Movement: Present     General:  Alert, oriented and cooperative. Patient is in no acute distress.  Skin: Skin is warm and dry. No rash noted.   Cardiovascular: Normal heart rate noted  Respiratory: Normal respiratory effort, no problems with respiration noted  Abdomen: Soft, gravid, appropriate for gestational age. Pain/Pressure: Absent     Pelvic:  Cervical exam deferred        Extremities: Normal range of motion.     ental Status: Normal mood and affect. Normal behavior. Normal judgment and thought content.   Fasting 98, 100, 106, 112 Breakfast: 121, 133 Lunch:  111, 89 Dinner: 123, 141,136   Assessment   37 y.o. G2P0010 at [redacted]w[redacted]d by  10/04/2017, by Last Menstrual Period presenting for routine prenatal visit  Plan   pregnancy #2 Problems (from 03/10/17 to present)    Problem Noted Resolved   Supervision of high risk pregnancy, antepartum, second trimester 04/24/2017 by Nadara Mustard, MD No   Overview Addendum 08/07/2017  9:12 AM by Natale Milch, MD    [facility] Prenatal Labs  Dating  LMP = 12wk [place] Blood type: A/Positive/-- (01/04 1118)   Genetic Screen  NIPS: Declined genetic screening Antibody:Negative (01/04 1118)  Anatomic [place]  complete- gender is surprise Rubella: 18.00 (01/04 1118) Varicella: Immune  GTT  Third trimester: 185  (3hr, 98/241/197) RPR: Non Reactive (01/04 1118)   Rhogam  not indicated HBsAg: Negative (01/04 1118)   TDaP vaccine   08/01/17                     Flu Shot: HIV: Non Reactive (01/04 1118)   Baby Food  Bottle                              GBS:   Contraception  IUD Pap: 2019 NIL HPV negative  CBB   Given information   CS/VBAC  not applicable   Support Person            Advanced maternal age in multigravida, first trimester 03/10/2017 by Natale Milch, MD No   Overview Signed 03/10/2017 10:49 AM by Natale Milch, MD    [ ]  NIPT offered          Gestational age appropriate obstetric precautions including but not limited to vaginal bleeding, contractions, leaking of fluid and fetal movement  were reviewed in detail with the patient.    Will start insulin with 10 units long acting and 5 units aspart with dinner. Start weekly NST/ AFI next week Patient attending diabetes education today.   Return in about 1 week (around 08/14/2017) for ROB, NST/ AFI.  Adelene Idlerhristanna Jader Desai MD [facility], [facility] 08/07/17 9:10 AM

## 2017-08-08 ENCOUNTER — Telehealth: Payer: Self-pay | Admitting: Dietician

## 2017-08-08 NOTE — Telephone Encounter (Signed)
Catherine Perez regarding rescheduling her appointment with the dietitian. She is now scheduled to meet with Catherine Perez, RD on 6/17. She reports that she took both her insulins- her Novalog before dinner last evening and her Lantus at bedtime. Her glucose 2 hours after dinner was 90 and her fasting blood sugar this morning was 106.  Encouraged her to call me or Johny Drilling, RN, the nurse she met with yesterday, if she has any questions before her 08/21/17 follow-up appointment.

## 2017-08-08 NOTE — Progress Notes (Signed)
Diabetes Self-Management Education  Visit Type: First/Initial  Appt. Start Time: 1310 Appt. End Time: 2482  08/07/2017  Catherine Perez, identified by name and date of birth, is a 37 y.o. female with a diagnosis of Diabetes: Gestational Diabetes.   ASSESSMENT  Blood pressure 112/64, height '5\' 3"'  (1.6 m), weight 180 lb 1.6 oz (81.7 kg), last menstrual period 12/28/2016. Body mass index is 31.9 kg/m.  Diabetes Self-Management Education - 08/07/17 1810      Visit Information   Visit Type  First/Initial      Initial Visit   Diabetes Type  Gestational Diabetes    Are you currently following a meal plan?  No    Are you taking your medications as prescribed?  Yes    Date Diagnosed  1 week      Health Coping   How would you rate your overall health?  Good      Psychosocial Assessment   Patient Belief/Attitude about Diabetes  Motivated to manage diabetes "it is what it is"    Self-care barriers  None    Self-management support  Doctor's office;Family    Patient Concerns  Nutrition/Meal planning;Glycemic Control    Special Needs  None    Preferred Learning Style  Auditory;Hands on    Learning Readiness  Change in progress    How often do you need to have someone help you when you read instructions, pamphlets, or other written materials from your doctor or pharmacy?  1 - Never    What is the last grade level you completed in school?  some college      Pre-Education Assessment   Patient understands the diabetes disease and treatment process.  Needs Instruction    Patient understands incorporating nutritional management into lifestyle.  Needs Instruction    Patient undertands incorporating physical activity into lifestyle.  Needs Instruction    Patient understands using medications safely.  Needs Instruction    Patient understands monitoring blood glucose, interpreting and using results  Needs Review    Patient understands prevention, detection, and treatment of acute  complications.  Needs Instruction    Patient understands prevention, detection, and treatment of chronic complications.  Needs Instruction    Patient understands how to develop strategies to address psychosocial issues.  Needs Instruction    Patient understands how to develop strategies to promote health/change behavior.  Needs Instruction      Complications   How often do you check your blood sugar?  3-4 times/day    Fasting Blood glucose range (mg/dL)  70-129 FBG's 98-112 mg/dL     Postprandial Blood glucose range (mg/dL)  70-129;130-179;<70 pp's 89-141    Have you had a dilated eye exam in the past 12 months?  No    Have you had a dental exam in the past 12 months?  Yes    Are you checking your feet?  No      Dietary Intake   Breakfast  oatmeal, cheese toast, ham biscuit or skips    Lunch  grilled chicken salads, left overs from supper    Dinner  chicken, pork, beef, fish, potatoes, bread, corn, peas, beans, salads, broccoli, cauliflower, green beans    Beverage(s)  water, regular soda, few drops of Crystal light in water      Exercise   Exercise Type  ADL's      Patient Education   Previous Diabetes Education  No    Disease state   Definition of diabetes, type 1 and 2,  and the diagnosis of diabetes    Nutrition management   Role of diet in the treatment of diabetes and the relationship between the three main macronutrients and blood glucose level;Food label reading, portion sizes and measuring food.;Reviewed blood glucose goals for pre and post meals and how to evaluate the patients' food intake on their blood glucose level.;Meal timing in regards to the patients' current diabetes medication.    Physical activity and exercise   Role of exercise on diabetes management, blood pressure control and cardiac health.    Medications  Taught/reviewed insulin injection, site rotation, insulin storage and needle disposal.;Reviewed patients medication for diabetes, action, purpose, timing of dose  and side effects. Instructed on vial/syringe and pen. Pt injected 4 units NS to left abdomen subcutaneously without difficulty.     Monitoring  Purpose and frequency of SMBG.;Taught/discussed recording of test results and interpretation of SMBG.;Ketone testing, when, how.    Acute complications  Taught treatment of hypoglycemia - the 15 rule.    Chronic complications  Relationship between chronic complications and blood glucose control    Psychosocial adjustment  Identified and addressed patients feelings and concerns about diabetes    Preconception care  Pregnancy and GDM  Role of pre-pregnancy blood glucose control on the development of the fetus;Role of family planning for patients with diabetes;Reviewed with patient blood glucose goals with pregnancy    Personal strategies to promote health  Review risk of smoking and offered smoking cessation      Individualized Goals (developed by patient)   Reducing Risk  Improve blood sugars     Outcomes   Expected Outcomes  Demonstrated interest in learning. Expect positive outcomes       Individualized Plan for Diabetes Self-Management Training:   Learning Objective:  Patient will have a greater understanding of diabetes self-management. Patient education plan is to attend individual and/or group sessions per assessed needs and concerns.   Plan:   Patient Instructions  Read booklet on Gestational Diabetes Follow Gestational Meal Planning Guidelines Don't skip meals Limit desserts and sweets Complete a 3 Day Food Record and bring to next appointment Check blood sugars 4 x day - before breakfast and 2 hrs after every meal and record  Bring blood sugar log to all appointments Purchase urine ketone strips if blood sugars not controlled and check urine ketones every am:  If + increase bedtime snack to 1 protein and 2 carbohydrate servings Walk 20-30 minutes at least 5 x week if permitted by MD Carry fast acting glucose and a snack at all  times Rotate injection sites Inject 5 units NovoLog before supper - eat within 15 minutes Inject 10 units Lantus at bedtime   Expected Outcomes:  Demonstrated interest in learning. Expect positive outcomes  Education material provided:  Gestational Booklet Gestational Meal Planning Guidelines Simple Meal Plan Viewed Gestational Diabetes Video 3 Day Food Record Goals for a Healthy Pregnancy Insulin start kit (pen) - BD Glucose tablets Symptoms, causes and treatments of Hypoglycemia  If problems or questions, patient to contact team via:  Johny Drilling, [place], [place], CDE [phone]  Future DSME appointment:  August 21, 2017 with the dietitian

## 2017-08-15 ENCOUNTER — Ambulatory Visit (INDEPENDENT_AMBULATORY_CARE_PROVIDER_SITE_OTHER): Payer: Managed Care, Other (non HMO) | Admitting: Maternal Newborn

## 2017-08-15 ENCOUNTER — Other Ambulatory Visit: Payer: Self-pay | Admitting: Obstetrics & Gynecology

## 2017-08-15 ENCOUNTER — Encounter: Payer: Self-pay | Admitting: Maternal Newborn

## 2017-08-15 ENCOUNTER — Ambulatory Visit: Payer: Managed Care, Other (non HMO)

## 2017-08-15 VITALS — BP 110/60 | Wt 182.0 lb

## 2017-08-15 DIAGNOSIS — O0992 Supervision of high risk pregnancy, unspecified, second trimester: Secondary | ICD-10-CM

## 2017-08-15 DIAGNOSIS — O24419 Gestational diabetes mellitus in pregnancy, unspecified control: Secondary | ICD-10-CM | POA: Insufficient documentation

## 2017-08-15 DIAGNOSIS — D509 Iron deficiency anemia, unspecified: Secondary | ICD-10-CM

## 2017-08-15 DIAGNOSIS — Z369 Encounter for antenatal screening, unspecified: Secondary | ICD-10-CM

## 2017-08-15 DIAGNOSIS — Z3A32 32 weeks gestation of pregnancy: Secondary | ICD-10-CM

## 2017-08-15 DIAGNOSIS — O24414 Gestational diabetes mellitus in pregnancy, insulin controlled: Secondary | ICD-10-CM

## 2017-08-15 NOTE — Progress Notes (Signed)
Routine Prenatal Care Visit  Subjective  Catherine Perez is a 37 y.o. G2P0010 at 6861w6d being seen today for ongoing prenatal care.  She is currently monitored for the following issues for this high-risk pregnancy and has Advanced maternal age in multigravida, first trimester; Supervision of high risk pregnancy, antepartum, second trimester; Pregnancy with abnormal glucose tolerance test (GTT); Anemia affecting pregnancy in third trimester; and Gestational diabetes mellitus on their problem list.  ----------------------------------------------------------------------------------- Patient reports no complaints.   Contractions: Not present. Vag. Bleeding: None.  Movement: Present. No leaking of fluid.  ----------------------------------------------------------------------------------- The following portions of the patient's history were reviewed and updated as appropriate: allergies, current medications, past family history, past medical history, past social history, past surgical history and problem list. Problem list updated.   Objective  Blood pressure 110/60, weight 182 lb (82.6 kg), last menstrual period 12/28/2016. Pregravid weight 155 lb (70.3 kg) Total Weight Gain 27 lb (12.2 kg) Urinalysis: Urine Protein: Negative Urine Glucose: Negative  Fetal Status: Fetal Heart Rate (bpm): 145 Fundal Height: 34 cm Movement: Present     General:  Alert, oriented and cooperative. Patient is in no acute distress.  Skin: Skin is warm and dry. No rash noted.   Cardiovascular: Normal heart rate noted  Respiratory: Normal respiratory effort, no problems with respiration noted  Abdomen: Soft, gravid, appropriate for gestational age. Pain/Pressure: Present     Pelvic:  Cervical exam deferred        Extremities: Normal range of motion.  Edema: Trace  Mental Status: Normal mood and affect. Normal behavior. Normal judgment and thought content.   NST Baseline: 145 Variability: moderate Accelerations:  present Decelerations: absent Tocometry: none The patient was monitored for 20 minutes, fetal heart rate tracing was deemed reactive, category I tracing.  Assessment   37 y.o. G2P0010 at 3561w6d, EDD 10/04/2017 by Last Menstrual Period presenting for routine prenatal visit.  Plan   pregnancy #2 Problems (from 03/10/17 to present)    Problem Noted Resolved   Gestational diabetes mellitus 08/15/2017 by Oswaldo ConroySchmid, Verleen Stuckey Y, CNM No   Overview Signed 08/15/2017  3:08 PM by Oswaldo ConroySchmid, Clydene Burack Y, CNM    Current Diabetic Medications:  Insulin  [ ]  Aspirin 81 mg daily after 12 weeks; discontinue after 36 weeks (? A2/B GDM)  Required Referrals for A1GDM or A2GDM: [x ] Diabetes Education and Testing Supplies [x ] Nutrition Cousult  For A2/B GDM or higher classes of DM [ ]  Diabetes Education and Testing Supplies [ ]  Nutrition Counsult [ ]  Fetal ECHO after 22-24 weeks  [ ]  Eye exam for retina evaluation  [ ]  Baseline EKG [ ]  US fetal growth every 4 weeks starting at 28 weeks [ ]  Twice weekly NST starting at [redacted] weeks gestation [ ]  Delivery planning contingent on fetal growth, AFI, glycemic control, and other co-morbidities but at least by 39 weeks  [facility] refresh links as needed)  Lab Results  Component Value Date   CREATININE 0.43 (L) 04/16/2017   AST 22 04/16/2017   ALT 27 04/16/2017   No results found for: HGBA1C  Antenatal Testing Class of DM U/S NST/AFI DELIVERY  Diabetes   A1 - good control - O24.410    A2 - good control - O24.419      A2  - poor control or poor compliance - O24.419, E11.65   (Macrosomia or polyhydramnios) **E11.65 is extra code for poor control**    A2/B - O24.919  and  B-C O24.319  Poor control B-C or D-R-F-T - O24.319  or  Type I DM - O24.019  20-38  20-38  20-24-28-32-36   20-24-28-32-35-38//fetal echo  20-24-27-30-33-36-38//fetal echo  40  32//2 x wk  32//2 x wk   32//2 x wk  28//BPP wkly then  32//2 x wk  40  39  PRN   39  PRN         Supervision of high risk pregnancy, antepartum, second trimester 04/24/2017 by Nadara Mustard, MD No   Overview Addendum 08/07/2017  9:33 AM by Natale Milch, MD    [facility] Prenatal Labs  Dating  LMP = 12wk [place] Blood type: A/Positive/-- (01/04 1118)   Genetic Screen  NIPS: Declined genetic screening Antibody:Negative (01/04 1118)  Anatomic [place]  complete- gender is surprise Rubella: 18.00 (01/04 1118) Varicella: Immune  GTT  Third trimester: 185  (3hr, 98/241/197) RPR: Non Reactive (01/04 1118)   Rhogam  not indicated HBsAg: Negative (01/04 1118)   TDaP vaccine   08/01/17                     Flu Shot: HIV: Non Reactive (01/04 1118)   Baby Food  Bottle                              GBS:   Contraception  IUD Pap: 2019 NIL HPV negative  CBB   Given information   CS/VBAC  not applicable   Support Person            Advanced maternal age in multigravida, first trimester 03/10/2017 by Natale Milch, MD No   Overview Signed 03/10/2017 10:49 AM by Natale Milch, MD    [ ]  NIPT offered      Pregnancy, supervision, high-risk, first trimester 03/10/2017 by Natale Milch, MD 06/19/2017 by Oswaldo Conroy, CNM   Overview Signed 03/10/2017 10:50 AM by Natale Milch, MD      [facility] Prenatal Labs  Dating  Blood type:     Genetic Screen 1 Screen:     AFP:      Quad:      NIPS:    Antibody:   Anatomic [place]  Rubella:   Varicella: @VZVIGG @  GTT Early:        28 wk:      RPR:     Rhogam  HBsAg:     TDaP vaccine                       HIV:     Flu Shot   Declines                             GBS:   Contraception  Pap:  CBB     CS/VBAC    Baby Food    Support Person                NST reactive today, AFI 9.04 cm.  Blood glucose log: All fasting BG values elevated; range from 95-118. Some postprandial values elevated (4/17) at 130, 150, 150, and 152; range from 79-152.  Increase Lantus  to 15 units at bedtime per discussion with Dr. Jerene Pitch.  Gestational age appropriate obstetric precautions including but not limited to vaginal bleeding, contractions, leaking of fluid and fetal movement were reviewed.  Return in about 1 week (  around 08/22/2017) for ROB with NST/AFI/Growth scan.  Marcelyn Bruins, CNM 08/15/2017  4:19 PM

## 2017-08-15 NOTE — Progress Notes (Signed)
No concerns.rj 

## 2017-08-16 ENCOUNTER — Ambulatory Visit: Payer: BLUE CROSS/BLUE SHIELD | Admitting: Dietician

## 2017-08-21 ENCOUNTER — Encounter: Payer: Self-pay | Admitting: Dietician

## 2017-08-21 ENCOUNTER — Encounter: Payer: BLUE CROSS/BLUE SHIELD | Admitting: Dietician

## 2017-08-21 VITALS — BP 120/70 | Ht 63.0 in | Wt 180.2 lb

## 2017-08-21 DIAGNOSIS — Z713 Dietary counseling and surveillance: Secondary | ICD-10-CM | POA: Diagnosis not present

## 2017-08-21 DIAGNOSIS — O24414 Gestational diabetes mellitus in pregnancy, insulin controlled: Secondary | ICD-10-CM

## 2017-08-21 NOTE — Progress Notes (Signed)
   Patient's BG record indicates BGs are fluctuating; fasting BGs ranging 89-118, and post-meal BGs ranging 79-152. Patient feels some post-supper BGs are elevated due to late meal, about 8pm due to work schedule. Lunch is 11am.   Patient's food diary indicates healthy food choices and generally balanced meals. Some meals might be low in carbohydrate.    Provided 1800kcal meal plan, and wrote individualized menus based on patient's food preferences. Advised consistent carb intake. Encouraged larger pm snack, or 2 small afternoon snacks to control suppertime hunger and evening BGs.  Instructed patient on food safety, including avoidance of Listeriosis, and limiting mercury from fish.  Discussed importance of maintaining healthy lifestyle habits to reduce risk of Type 2 DM as well as Gestational DM with any future pregnancies.  Advised patient to use any remaining testing supplies to test some BGs after delivery, and to have BG tested ideally annually, as well as prior to attempting future pregnancies.

## 2017-08-21 NOTE — Patient Instructions (Signed)
   Try a larger afternoon snack, or have 2 snacks during the afternoon to decrease hunger at suppertime, and hopefully decrease after-supper blood sugar.   Keep carb intake as consistent as possible, regardless of blood sugar.

## 2017-08-22 ENCOUNTER — Encounter: Payer: Self-pay | Admitting: Obstetrics and Gynecology

## 2017-08-22 ENCOUNTER — Encounter: Payer: Self-pay | Admitting: Maternal Newborn

## 2017-08-22 ENCOUNTER — Ambulatory Visit (INDEPENDENT_AMBULATORY_CARE_PROVIDER_SITE_OTHER): Payer: Managed Care, Other (non HMO) | Admitting: Maternal Newborn

## 2017-08-22 ENCOUNTER — Ambulatory Visit (INDEPENDENT_AMBULATORY_CARE_PROVIDER_SITE_OTHER): Payer: Managed Care, Other (non HMO)

## 2017-08-22 VITALS — BP 126/74 | Wt 182.0 lb

## 2017-08-22 DIAGNOSIS — Z3A34 34 weeks gestation of pregnancy: Secondary | ICD-10-CM

## 2017-08-22 DIAGNOSIS — O0993 Supervision of high risk pregnancy, unspecified, third trimester: Secondary | ICD-10-CM

## 2017-08-22 DIAGNOSIS — O0992 Supervision of high risk pregnancy, unspecified, second trimester: Secondary | ICD-10-CM

## 2017-08-22 DIAGNOSIS — O24414 Gestational diabetes mellitus in pregnancy, insulin controlled: Secondary | ICD-10-CM

## 2017-08-22 DIAGNOSIS — O09513 Supervision of elderly primigravida, third trimester: Secondary | ICD-10-CM | POA: Diagnosis not present

## 2017-08-22 DIAGNOSIS — Z369 Encounter for antenatal screening, unspecified: Secondary | ICD-10-CM

## 2017-08-22 LAB — FETAL NONSTRESS TEST

## 2017-08-22 NOTE — Progress Notes (Signed)
No concerns.rj 

## 2017-08-22 NOTE — Progress Notes (Signed)
Prenatal Care Visit  Subjective  Catherine Perez is a 37 y.o. G2P0010 at [redacted]w[redacted]d being seen today for ongoing prenatal care.  She is currently monitored for the following issues for this high-risk pregnancy and has Advanced maternal age in multigravida, first trimester; Supervision of high risk pregnancy, antepartum, second trimester; Pregnancy with abnormal glucose tolerance test (GTT); Anemia affecting pregnancy in third trimester; and Gestational diabetes mellitus on their problem list.  ----------------------------------------------------------------------------------- Patient reports no complaints.   Contractions: Not present. Vag. Bleeding: None.  Movement: Present. No leaking of fluid.  ----------------------------------------------------------------------------------- The following portions of the patient's history were reviewed and updated as appropriate: allergies, current medications, past family history, past medical history, past social history, past surgical history and problem list. Problem list updated.  Objective   Vitals:   08/22/17 1536  BP: 126/74   Pregravid weight 155 lb (70.3 kg) Total Weight Gain 27 lb (12.2 kg)  Body mass index is 32.24 kg/m. Urinalysis: Urine Protein: Negative Urine Glucose: Negative  Fetal Status: Fetal Heart Rate (bpm): 135 Fundal Height: 36 cm Movement: Present     General:  Alert, oriented and cooperative. Patient is in no acute distress.  Skin: Skin is warm and dry. No rash noted.   Cardiovascular: Normal heart rate noted  Respiratory: Normal respiratory effort, no problems with respiration noted  Abdomen: Soft, gravid, appropriate for gestational age. Pain/Pressure: Present     Pelvic:  Cervical exam deferred        Extremities: Normal range of motion.     Mental Status: Normal mood and affect. Normal behavior. Normal judgment and thought content.   NST Baseline: 135 Variability: moderate Accelerations: present, technically  difficult to keep baby on monitor at the end of the test, but I noted 3 accelerations to the 160s while directly observing patient Decelerations: absent Tocometry: none The patient was monitored for 20+ minutes, fetal heart rate tracing was deemed reactive, category I tracing.  Assessment   37 y.o. G2P0010 at [redacted]w[redacted]d, EDD 10/04/2017 by Last Menstrual Period presenting for routine prenatal visit.  Plan   pregnancy #2 Problems (from 03/10/17 to present)    Problem Noted Resolved   Gestational diabetes mellitus 08/15/2017 by Oswaldo Conroy, CNM No   Overview Signed 08/15/2017  3:08 PM by Oswaldo Conroy, CNM    Current Diabetic Medications:  Insulin  [ ]  Aspirin 81 mg daily after 12 weeks; discontinue after 36 weeks (? A2/B GDM)  Required Referrals for A1GDM or A2GDM: [x ] Diabetes Education and Testing Supplies [x ] Nutrition Cousult  For A2/B GDM or higher classes of DM [ ]  Diabetes Education and Testing Supplies [ ]  Nutrition Counsult [ ]  Fetal ECHO after 22-24 weeks  [ ]  Eye exam for retina evaluation  [ ]  Baseline EKG [ ]  US fetal growth every 4 weeks starting at 28 weeks [ ]  Twice weekly NST starting at [redacted] weeks gestation [ ]  Delivery planning contingent on fetal growth, AFI, glycemic control, and other co-morbidities but at least by 39 weeks  Baseline and surveillance labs (pulled in from [facility], refresh links as needed)  Lab Results  Component Value Date   CREATININE 0.43 (L) 04/16/2017   AST 22 04/16/2017   ALT 27 04/16/2017   No results found for: HGBA1C  Antenatal Testing Class of DM U/S NST/AFI DELIVERY  Diabetes   A1 - good control - O24.410    A2 - good control - O24.419      A2  -  poor control or poor compliance - O24.419, E11.65   (Macrosomia or polyhydramnios) **E11.65 is extra code for poor control**    A2/B - O24.919  and B-C O24.319  Poor control B-C or D-R-F-T - O24.319  or  Type I DM - O24.019   20-38  20-38  20-24-28-32-36   20-24-28-32-35-38//fetal echo  20-24-27-30-33-36-38//fetal echo  40  32//2 x wk  32//2 x wk   32//2 x wk  28//BPP wkly then 32//2 x wk  40  39  PRN   39  PRN         Supervision of high risk pregnancy, antepartum, second trimester 04/24/2017 by Nadara MustardHarris, Robert P, MD No   Overview Addendum 08/07/2017  9:33 AM by Natale MilchSchuman, Christanna R, MD    [facility] Prenatal Labs  Dating  LMP = 12wk US Blood type: A/Positive/-- (01/04 1118)   Genetic Screen  NIPS: Declined genetic screening Antibody:Negative (01/04 1118)  Anatomic US  complete- gender is surprise Rubella: 18.00 (01/04 1118) Varicella: Immune  GTT  Third trimester: 185  (3hr, 98/241/197) RPR: Non Reactive (01/04 1118)   Rhogam  not indicated HBsAg: Negative (01/04 1118)   TDaP vaccine   08/01/17                     Flu Shot: HIV: Non Reactive (01/04 1118)   Baby Food  Bottle                              GBS:   Contraception  IUD Pap: 2019 NIL HPV negative  CBB   Given information   CS/VBAC  not applicable   Support Person            Advanced maternal age in multigravida, first trimester 03/10/2017 by Natale MilchSchuman, Christanna R, MD No   Overview Signed 03/10/2017 10:49 AM by Natale MilchSchuman, Christanna R, MD    [ ]  NIPT offered      Pregnancy, supervision, high-risk, first trimester 03/10/2017 by Natale MilchSchuman, Christanna R, MD 06/19/2017 by Oswaldo ConroySchmid, Jacelyn Y, CNM   Overview Signed 03/10/2017 10:50 AM by Natale MilchSchuman, Christanna R, MD      [facility] Prenatal Labs  Dating  Blood type:     Genetic Screen 1 Screen:     AFP:      Quad:      NIPS:    Antibody:   Anatomic US  Rubella:   Varicella: @VZVIGG @  GTT Early:        28 wk:      RPR:     Rhogam  HBsAg:     TDaP vaccine                       HIV:     Flu Shot   Declines                             GBS:   Contraception  Pap:  CBB     CS/VBAC    Baby Food    Support Person                NST reactive, see notes above, AFI  13.89 cm, Growth at  67.1 percentile, EFW 5 lb 12 oz.  Blood glucose log: Fasting: 89, 92, 89, 96, 94, 98, 97 After breakfast: 103, 101, 103, 88, 91, 110, 90 After lunch: 82, 92, 81, 95,  126 After dinner: 122, 103, 92, 90, 90  After review with Dr. Jerene Pitch, advised Morrie Sheldon to increase bedtime Lantus from 15 units to 17 units to help with fasting BG control and keep Novolog at 5 units with dinner.  Return in about 1 week (around 08/29/2017) for HROB with NST/AFI.  Marcelyn Bruins, CNM 08/22/2017  4:32 PM

## 2017-08-23 NOTE — Telephone Encounter (Signed)
Can you please call this patient's pharmacy so that she can receive her lantus, her does was recently increased to 17 untis.  Thank you,  Dr. Jerene PitchSchuman

## 2017-08-23 NOTE — Telephone Encounter (Signed)
Thank you Kasey!

## 2017-08-23 NOTE — Telephone Encounter (Signed)
Please advise 

## 2017-08-30 ENCOUNTER — Encounter: Payer: Self-pay | Admitting: Obstetrics and Gynecology

## 2017-08-30 ENCOUNTER — Ambulatory Visit (INDEPENDENT_AMBULATORY_CARE_PROVIDER_SITE_OTHER): Payer: Managed Care, Other (non HMO)

## 2017-08-30 ENCOUNTER — Ambulatory Visit (INDEPENDENT_AMBULATORY_CARE_PROVIDER_SITE_OTHER): Payer: Managed Care, Other (non HMO) | Admitting: Obstetrics and Gynecology

## 2017-08-30 VITALS — BP 124/68 | Wt 184.5 lb

## 2017-08-30 DIAGNOSIS — Z3A35 35 weeks gestation of pregnancy: Secondary | ICD-10-CM | POA: Diagnosis not present

## 2017-08-30 DIAGNOSIS — O0992 Supervision of high risk pregnancy, unspecified, second trimester: Secondary | ICD-10-CM

## 2017-08-30 DIAGNOSIS — O09523 Supervision of elderly multigravida, third trimester: Secondary | ICD-10-CM

## 2017-08-30 DIAGNOSIS — O99013 Anemia complicating pregnancy, third trimester: Secondary | ICD-10-CM

## 2017-08-30 DIAGNOSIS — Z369 Encounter for antenatal screening, unspecified: Secondary | ICD-10-CM

## 2017-08-30 DIAGNOSIS — O24414 Gestational diabetes mellitus in pregnancy, insulin controlled: Secondary | ICD-10-CM

## 2017-08-30 DIAGNOSIS — O09521 Supervision of elderly multigravida, first trimester: Secondary | ICD-10-CM

## 2017-08-30 LAB — FETAL NONSTRESS TEST

## 2017-08-30 NOTE — Progress Notes (Incomplete)
Routine Prenatal Care Visit  Subjective  Catherine Perez is a 37 y.o. G2P0010 at [redacted]w[redacted]d being seen today for ongoing prenatal care.  She is currently monitored for the following issues for this {Blank single:19197::"high-risk","low-risk"} pregnancy and has Advanced maternal age in multigravida, first trimester; Supervision of high risk pregnancy, antepartum, second trimester; Pregnancy with abnormal glucose tolerance test (GTT); Anemia affecting pregnancy in third trimester; and Gestational diabetes mellitus on their problem list.  ----------------------------------------------------------------------------------- Patient reports {sx:[id number]}.   Contractions: Not present. Vag. Bleeding: None.  Movement: Present. Denies leaking of fluid.  ----------------------------------------------------------------------------------- The following portions of the patient's history were reviewed and updated as appropriate: allergies, current medications, past family history, past medical history, past social history, past surgical history and problem list. Problem list updated.   Objective  Blood pressure 124/68, weight 184 lb 8 oz (83.7 kg), last menstrual period 12/28/2016. Pregravid weight 155 lb (70.3 kg) Total Weight Gain 29 lb 8 oz (13.4 kg) Urinalysis: Urine Protein: Negative Urine Glucose: Negative  Fetal Status: Fetal Heart Rate (bpm): 130   Movement: Present  Presentation: Vertex  General:  Alert, oriented and cooperative. Patient is in no acute distress.  Skin: Skin is warm and dry. No rash noted.   Cardiovascular: Normal heart rate noted  Respiratory: Normal respiratory effort, no problems with respiration noted  Abdomen: Soft, gravid, appropriate for gestational age. Pain/Pressure: Absent     Pelvic:  {Blank single:19197::"Cervical exam performed","Cervical exam deferred"}        Extremities: Normal range of motion.     Mental Status: Normal mood and affect. Normal behavior. Normal judgment  and thought content.   Assessment   37 y.o. G2P0010 at [redacted]w[redacted]d by  10/04/2017, by Last Menstrual Period presenting for {Blank single:19197::"routine","work-in"} prenatal visit  Plan   pregnancy #2 Problems (from 03/10/17 to present)    Problem Noted Resolved   Gestational diabetes mellitus 08/15/2017 by Oswaldo Conroy, CNM No   Overview Addendum 08/30/2017 10:35 AM by Conard Novak, MD    Current Diabetic Medications:  Insulin lantus 17 units HS, aspart 5 units ac dinner [ ]  Aspirin 81 mg daily after 12 weeks; discontinue after 36 weeks (? A2/B GDM)  Required Referrals for A1GDM or A2GDM: [x ] Diabetes Education and Testing Supplies [x ] Nutrition Cousult  For A2/B GDM or higher classes of DM [ ]  Diabetes Education and Testing Supplies [ ]  Nutrition Counsult [ ]  Fetal ECHO after 22-24 weeks  [ ]  Eye exam for retina evaluation  [ ]  Baseline EKG [ ]  US fetal growth every 4 weeks starting at 28 weeks [ ]  Twice weekly NST starting at [redacted] weeks gestation [ ]  Delivery planning contingent on fetal growth, AFI, glycemic control, and other co-morbidities but at least by 39 weeks  Baseline and surveillance labs (pulled in from [facility], refresh links as needed)  Lab Results  Component Value Date   CREATININE 0.43 (L) 04/16/2017   AST 22 04/16/2017   ALT 27 04/16/2017   No results found for: HGBA1C  Antenatal Testing Class of DM U/S NST/AFI DELIVERY  Diabetes   A1 - good control - O24.410    A2 - good control - O24.419      A2  - poor control or poor compliance - O24.419, E11.65   (Macrosomia or polyhydramnios) **E11.65 is extra code for poor control**    A2/B - O24.919  and B-C O24.319  Poor control B-C or D-R-F-T - O24.319  or  Type I DM -  O24.019  20-38  20-38  20-24-28-32-36   20-24-28-32-35-38//fetal echo  20-24-27-30-33-36-38//fetal echo  40  32//2 x wk  32//2 x wk   32//2 x wk  28//BPP wkly then 32//2 x wk  40  39  PRN   39  PRN          Supervision of high risk pregnancy, antepartum, second trimester 04/24/2017 by Nadara MustardHarris, Robert P, MD No   Overview Addendum 08/07/2017  9:33 AM by Natale MilchSchuman, Christanna R, MD    [facility]inic [facility] Prenatal Labs  Dating  LMP = 12wk US Blood type: A/Positive/-- (01/04 1118)   Genetic Screen  NIPS: Declined genetic screening Antibody:Negative (01/04 1118)  Anatomic US  complete- gender is surprise Rubella: 18.00 (01/04 1118) Varicella: Immune  GTT  Third trimester: 185  (3hr, 98/241/197) RPR: Non Reactive (01/04 1118)   Rhogam  not indicated HBsAg: Negative (01/04 1118)   TDaP vaccine   08/01/17                     Flu Shot: HIV: Non Reactive (01/04 1118)   Baby Food  Bottle                              GBS:   Contraception  IUD Pap: 2019 NIL HPV negative  CBB   Given information   CS/VBAC  not applicable   Support Person            Advanced maternal age in multigravida, first trimester 03/10/2017 by Natale MilchSchuman, Christanna R, MD No   Overview Signed 03/10/2017 10:49 AM by Natale MilchSchuman, Christanna R, MD    [ ]  NIPT offered      Pregnancy, supervision, high-risk, first trimester 03/10/2017 by Natale MilchSchuman, Christanna R, MD 06/19/2017 by Oswaldo ConroySchmid, Jacelyn Y, CNM   Overview Signed 03/10/2017 10:50 AM by Natale MilchSchuman, Christanna R, MD      [facility] Prenatal Labs  Dating  Blood type:     Genetic Screen 1 Screen:     AFP:      Quad:      NIPS:    Antibody:   Anatomic US  Rubella:   Varicella: @VZVIGG @  GTT Early:        28 wk:      RPR:     Rhogam  HBsAg:     TDaP vaccine                       HIV:     Flu Shot   Declines                             GBS:   Contraception  Pap:  CBB     CS/VBAC    Baby Food    Support Person                 {Blank single:19197::"Term","Preterm"} labor symptoms and general obstetric precautions including but not limited to vaginal bleeding, contractions, leaking of fluid and fetal movement were reviewed in detail with the patient. Please refer to After Visit Summary for  other counseling recommendations.   Return in about 5 days (around 09/04/2017) for ROB/NST and 8 days for u/s for AFI with ROB/NST.  Thomasene MohairStephen Jovanni Eckhart, MD, Merlinda FrederickFACOG [facility], [facility] 08/30/2017 10:36 AM

## 2017-08-30 NOTE — Progress Notes (Signed)
Routine Prenatal Care Visit  Subjective  Catherine Perez is a 37 y.o. G2P0010 at [redacted]w[redacted]d being seen today for ongoing prenatal care.  She is currently monitored for the following issues for this high-risk pregnancy and has Advanced maternal age in multigravida, first trimester; Supervision of high risk pregnancy, antepartum, second trimester; Pregnancy with abnormal glucose tolerance test (GTT); Anemia affecting pregnancy in third trimester; and Gestational diabetes mellitus on their problem list.  ----------------------------------------------------------------------------------- Patient reports no complaints.   Contractions: Not present. Vag. Bleeding: None.  Movement: Present. Denies leaking of fluid.  U/S with AFI of 15 cm, cephalic presentation GDMA2: did not bring BG log. However, states that all values normal now.  States will send picture of BG log through [employer]. ----------------------------------------------------------------------------------- The following portions of the patient's history were reviewed and updated as appropriate: allergies, current medications, past family history, past medical history, past social history, past surgical history and problem list. Problem list updated.   Objective  Blood pressure 124/68, weight 184 lb 8 oz (83.7 kg), last menstrual period 12/28/2016. Pregravid weight 155 lb (70.3 kg) Total Weight Gain 29 lb 8 oz (13.4 kg) Urinalysis: Urine Protein: Negative Urine Glucose: Negative  Fetal Status: Fetal Heart Rate (bpm): 130   Movement: Present  Presentation: Vertex  General:  Alert, oriented and cooperative. Patient is in no acute distress.  Skin: Skin is warm and dry. No rash noted.   Cardiovascular: Normal heart rate noted  Respiratory: Normal respiratory effort, no problems with respiration noted  Abdomen: Soft, gravid, appropriate for gestational age. Pain/Pressure: Absent     Pelvic:  Cervical exam deferred        Extremities:  Normal range of motion.     Mental Status: Normal mood and affect. Normal behavior. Normal judgment and thought content.   NST: Baseline FHR: 130 beats/min Variability: moderate Accelerations: present Decelerations: absent Tocometry: not done  Interpretation:  INDICATIONS: gestational diabetes mellitus RESULTS:  A NST procedure was performed with FHR monitoring and a normal baseline established, appropriate time of 20-40 minutes of evaluation, and accels >2 seen w 15x15 characteristics.  Results show a REACTIVE NST.    IMAGING: US Ob Limited  Result Date: 08/30/2017 ULTRASOUND REPORT Patient Name: ARMANDO LAUMAN DOB: July 05, 1980 MRN: [id number] Location: [facility] Date of Service: 08/30/2017 Indications: AFI for gestational diabetes Findings: Mason Jim intrauterine pregnancy is visualized with FHR at 151 BPM. Fetal presentation is Cephalic. Placenta: Anterior, grade 2. AFI: 15.61 cm Impression: 1. [redacted]w[redacted]d Viable Singleton Intrauterine pregnancy dated by previously established criteria. 2. AFI is 15.61 cm. Recommendations: 1.Clinical correlation with the patient's History and Physical Exam. Willette Alma, RDMS, RVT The ultrasound images and findings were reviewed by me and I agree with the above report. Thomasene Mohair, MD, [facility], [facility] 08/30/2017 10:13 AM      Assessment   37 y.o. G2P0010 at [redacted]w[redacted]d by  10/04/2017, by Last Menstrual Period presenting for routine prenatal visit  Plan   pregnancy #2 Problems (from 03/10/17 to present)    Problem Noted Resolved   Gestational diabetes mellitus 08/15/2017 by Oswaldo Conroy, CNM No   Overview Addendum 08/30/2017 10:35 AM by Conard Novak, MD    Current Diabetic Medications:  Insulin lantus 17 units HS, aspart 5 units ac dinner [ ]  Aspirin 81 mg daily after 12 weeks; discontinue after 36 weeks (? A2/B GDM)  Required Referrals for A1GDM or A2GDM: [x ] Diabetes Education and Testing Supplies [x ]  Nutrition Cousult  For A2/B GDM or higher classes of DM [ ]  Diabetes Education and Testing Supplies [ ]  Nutrition Counsult [ ]  Fetal ECHO after 22-24 weeks  [ ]  Eye exam for retina evaluation  [ ]  Baseline EKG [ ]  US fetal growth every 4 weeks starting at 28 weeks [ ]  Twice weekly NST starting at [redacted] weeks gestation [ ]  Delivery planning contingent on fetal growth, AFI, glycemic control, and other co-morbidities but at least by 39 weeks  Baseline and surveillance labs (pulled in from [facility], refresh links as needed)  Lab Results  Component Value Date   CREATININE 0.43 (L) 04/16/2017   AST 22 04/16/2017   ALT 27 04/16/2017   No results found for: HGBA1C  Antenatal Testing Class of DM U/S NST/AFI DELIVERY  Diabetes   A1 - good control - O24.410    A2 - good control - O24.419      A2  - poor control or poor compliance - O24.419, E11.65   (Macrosomia or polyhydramnios) **E11.65 is extra code for poor control**    A2/B - O24.919  and B-C O24.319  Poor control B-C or D-R-F-T - O24.319  or  Type I DM - O24.019  20-38  20-38  20-24-28-32-36   20-24-28-32-35-38//fetal echo  20-24-27-30-33-36-38//fetal echo  40  32//2 x wk  32//2 x wk   32//2 x wk  28//BPP wkly then 32//2 x wk  40  39  PRN   39  PRN         Supervision of high risk pregnancy, antepartum, second trimester 04/24/2017 by Nadara Mustard, MD No   Overview Addendum 08/07/2017  9:33 AM by Natale Milch, MD    [facility] Prenatal Labs  Dating  LMP = 12wk [place] Blood type: A/Positive/-- (01/04 1118)   Genetic Screen  NIPS: Declined genetic screening Antibody:Negative (01/04 1118)  Anatomic [place]  complete- gender is surprise Rubella: 18.00 (01/04 1118) Varicella: Immune  GTT  Third trimester: 185  (3hr, 98/241/197) RPR: Non Reactive (01/04 1118)   Rhogam  not indicated HBsAg: Negative (01/04 1118)   TDaP vaccine   08/01/17                     Flu Shot: HIV: Non Reactive (01/04 1118)     Baby Food  Bottle                              GBS:   Contraception  IUD Pap: 2019 NIL HPV negative  CBB   Given information   CS/VBAC  not applicable   Support Person            Advanced maternal age in multigravida, first trimester 03/10/2017 by Natale Milch, MD No   Overview Signed 03/10/2017 10:49 AM by Natale Milch, MD    [ ]  NIPT offered      Pregnancy, supervision, high-risk, first trimester 03/10/2017 by Natale Milch, MD 06/19/2017 by Oswaldo Conroy, CNM   Overview Signed 03/10/2017 10:50 AM by Natale Milch, MD      [facility] Prenatal Labs  Dating  Blood type:     Genetic Screen 1 Screen:     AFP:      Quad:      NIPS:    Antibody:   Anatomic [place]  Rubella:   Varicella: @VZVIGG @  GTT Early:        28 wk:  RPR:     Rhogam  HBsAg:     TDaP vaccine                       HIV:     Flu Shot   Declines                             GBS:   Contraception  Pap:  CBB     CS/VBAC    Baby Food    Support Person                 Preterm labor symptoms and general obstetric precautions including but not limited to vaginal bleeding, contractions, leaking of fluid and fetal movement were reviewed in detail with the patient. Please refer to After Visit Summary for other counseling recommendations.   Switch to twice weekly ROB/NST visits with weekly AFI. Last growth u/s 1 week ago and was normal. May consider repeat in 2 weeks.  Discussed delivery timing around 39 weeks, if has excellent BG control on insulin.   Return in about 5 days (around 09/04/2017) for ROB/NST and 8 days for u/s for AFI with ROB/NST.  Thomasene MohairStephen [place], MD, Merlinda FrederickFACOG [facility], [facility] 08/30/2017 10:36 AM

## 2017-09-04 ENCOUNTER — Ambulatory Visit (INDEPENDENT_AMBULATORY_CARE_PROVIDER_SITE_OTHER): Payer: Managed Care, Other (non HMO) | Admitting: Certified Nurse Midwife

## 2017-09-04 VITALS — BP 130/70 | Wt 183.0 lb

## 2017-09-04 DIAGNOSIS — Z3A35 35 weeks gestation of pregnancy: Secondary | ICD-10-CM

## 2017-09-04 DIAGNOSIS — O0992 Supervision of high risk pregnancy, unspecified, second trimester: Secondary | ICD-10-CM

## 2017-09-04 DIAGNOSIS — O24414 Gestational diabetes mellitus in pregnancy, insulin controlled: Secondary | ICD-10-CM | POA: Diagnosis not present

## 2017-09-04 LAB — FETAL NONSTRESS TEST

## 2017-09-04 NOTE — Progress Notes (Signed)
Pt reports no problems. NST today.  

## 2017-09-08 ENCOUNTER — Ambulatory Visit (INDEPENDENT_AMBULATORY_CARE_PROVIDER_SITE_OTHER): Payer: Managed Care, Other (non HMO)

## 2017-09-08 ENCOUNTER — Other Ambulatory Visit (HOSPITAL_COMMUNITY)
Admission: RE | Admit: 2017-09-08 | Discharge: 2017-09-08 | Disposition: A | Discharge status: Home / Self Care | Payer: BLUE CROSS/BLUE SHIELD | Source: Ambulatory Visit | Attending: Maternal Newborn | Admitting: Maternal Newborn

## 2017-09-08 ENCOUNTER — Ambulatory Visit (INDEPENDENT_AMBULATORY_CARE_PROVIDER_SITE_OTHER): Payer: Managed Care, Other (non HMO) | Admitting: Maternal Newborn

## 2017-09-08 VITALS — BP 130/80 | Wt 185.0 lb

## 2017-09-08 DIAGNOSIS — Z3A36 36 weeks gestation of pregnancy: Secondary | ICD-10-CM

## 2017-09-08 DIAGNOSIS — O24414 Gestational diabetes mellitus in pregnancy, insulin controlled: Secondary | ICD-10-CM | POA: Diagnosis not present

## 2017-09-08 DIAGNOSIS — O09523 Supervision of elderly multigravida, third trimester: Secondary | ICD-10-CM | POA: Diagnosis not present

## 2017-09-08 DIAGNOSIS — Z369 Encounter for antenatal screening, unspecified: Secondary | ICD-10-CM

## 2017-09-08 DIAGNOSIS — O0991 Supervision of high risk pregnancy, unspecified, first trimester: Secondary | ICD-10-CM | POA: Insufficient documentation

## 2017-09-08 DIAGNOSIS — O0992 Supervision of high risk pregnancy, unspecified, second trimester: Secondary | ICD-10-CM

## 2017-09-08 DIAGNOSIS — Z113 Encounter for screening for infections with a predominantly sexual mode of transmission: Secondary | ICD-10-CM

## 2017-09-08 DIAGNOSIS — O09521 Supervision of elderly multigravida, first trimester: Secondary | ICD-10-CM

## 2017-09-08 LAB — OB RESULTS CONSOLE GBS: STREP GROUP B AG: NEGATIVE

## 2017-09-08 NOTE — Progress Notes (Signed)
C/o concerns; is allergic to amoxicillin (needs GBS today).rj

## 2017-09-08 NOTE — Progress Notes (Signed)
Routine Prenatal Care Visit  Subjective  Catherine Perez is a 37 y.o. G2P0010 at 10327w2d being seen today for ongoing prenatal care.  She is currently monitored for the following issues for this high-risk pregnancy and has Advanced maternal age in multigravida, first trimester; Supervision of high risk pregnancy, antepartum, second trimester; Pregnancy with abnormal glucose tolerance test (GTT); Anemia affecting pregnancy in third trimester; and Gestational diabetes mellitus on their problem list.  ---------------------------------------------------------------------------------- Patient reports no complaints.   Contractions: Not present. Vag. Bleeding: None.  Movement: Present. No leaking of fluid.  ---------------------------------------------------------------------------------- The following portions of the patient's history were reviewed and updated as appropriate: allergies, current medications, past family history, past medical history, past social history, past surgical history and problem list. Problem list updated.   Objective  Blood pressure 130/80, weight 185 lb (83.9 kg), last menstrual period 12/28/2016. Pregravid weight 155 lb (70.3 kg) Total Weight Gain 30 lb (13.6 kg) Urinalysis:      Fetal Status: Fetal Heart Rate (bpm): 145   Movement: Present  Presentation: Vertex  General:  Alert, oriented and cooperative. Patient is in no acute distress.  Skin: Skin is warm and dry. No rash noted.   Cardiovascular: Normal heart rate noted  Respiratory: Normal respiratory effort, no problems with respiration noted  Abdomen: Soft, gravid, appropriate for gestational age. Pain/Pressure: Absent     Pelvic:  Cervical exam deferred        Extremities: Normal range of motion.  Edema: Trace  Mental Status: Normal mood and affect. Normal behavior. Normal judgment and thought content.  NST Baseline: 145 Variability: moderate Accelerations: present Decelerations: absent Tocometry: not  done The patient was monitored for 20 minutes, fetal heart rate tracing was deemed reactive.  Assessment   37 y.o. G2P0010 at 7327w2d, EDD 10/04/2017 by Last Menstrual Period presenting for routine prenatal visit.  Plan   pregnancy #2 Problems (from 03/10/17 to present)    Problem Noted Resolved   Gestational diabetes mellitus 08/15/2017 by Oswaldo ConroySchmid, Jacelyn Y, CNM No   Overview Addendum 08/30/2017 10:35 AM by Conard NovakJackson, Stephen D, MD    Current Diabetic Medications:  Insulin lantus 17 units HS, aspart 5 units ac dinner [ ]  Aspirin 81 mg daily after 12 weeks; discontinue after 36 weeks (? A2/B GDM)  Required Referrals for A1GDM or A2GDM: [x ] Diabetes Education and Testing Supplies [x ] Nutrition Cousult  For A2/B GDM or higher classes of DM [ ]  Diabetes Education and Testing Supplies [ ]  Nutrition Counsult [ ]  Fetal ECHO after 22-24 weeks  [ ]  Eye exam for retina evaluation  [ ]  Baseline EKG [ ]  US fetal growth every 4 weeks starting at 28 weeks [ ]  Twice weekly NST starting at [redacted] weeks gestation [ ]  Delivery planning contingent on fetal growth, AFI, glycemic control, and other co-morbidities but at least by 39 weeks  [facility] and surveillance [facility], refresh links as needed)  Lab Results  Component Value Date   CREATININE 0.43 (L) 04/16/2017   AST 22 04/16/2017   ALT 27 04/16/2017   No results found for: HGBA1C  Antenatal Testing Class of DM U/S NST/AFI DELIVERY  Diabetes   A1 - good control - O24.410    A2 - good control - O24.419      A2  - poor control or poor compliance - O24.419, E11.65   (Macrosomia or polyhydramnios) **E11.65 is extra code for poor control**    A2/B - O24.919  and  B-C O24.319  Poor control B-C or D-R-F-T - O24.319  or  Type I DM - O24.019  20-38  20-38  20-24-28-32-36   20-24-28-32-35-38//fetal echo  20-24-27-30-33-36-38//fetal echo  40  32//2 x wk  32//2 x wk   32//2 x wk  28//BPP wkly then 32//2 x wk   40  39  PRN   39  PRN         Supervision of high risk pregnancy, antepartum, second trimester 04/24/2017 by Nadara Mustard, MD No   Overview Addendum 08/07/2017  9:33 AM by Natale Milch, MD    [facility] Prenatal Labs  Dating  LMP = 12wk [place] Blood type: A/Positive/-- (01/04 1118)   Genetic Screen  NIPS: Declined genetic screening Antibody:Negative (01/04 1118)  Anatomic [place]  complete- gender is surprise Rubella: 18.00 (01/04 1118) Varicella: Immune  GTT  Third trimester: 185  (3hr, 98/241/197) RPR: Non Reactive (01/04 1118)   Rhogam  not indicated HBsAg: Negative (01/04 1118)   TDaP vaccine   08/01/17                     Flu Shot: HIV: Non Reactive (01/04 1118)   Baby Food  Bottle                              GBS:   Contraception  IUD Pap: 2019 NIL HPV negative  CBB   Given information   CS/VBAC  not applicable   Support Person            Advanced maternal age in multigravida, first trimester 03/10/2017 by Natale Milch, MD No   Overview Signed 03/10/2017 10:49 AM by Natale Milch, MD    [ ]  NIPT offered      Pregnancy, supervision, high-risk, first trimester 03/10/2017 by Natale Milch, MD 06/19/2017 by Oswaldo Conroy, CNM   Overview Signed 03/10/2017 10:50 AM by Natale Milch, MD      [facility]inic [facility] Prenatal Labs  Dating  Blood type:     Genetic Screen 1 Screen:     AFP:      Quad:      NIPS:    Antibody:   Anatomic [place]  Rubella:   Varicella: @VZVIGG @  GTT Early:        28 wk:      RPR:     Rhogam  HBsAg:     TDaP vaccine                       HIV:     Flu Shot   Declines                             GBS:   Contraception  Pap:  CBB     CS/VBAC    Baby Food    Support Person              Glucose log reviewed. Fasting values all within range (89, 84, 90, 84, 87). 2 postprandial values elevated (98, 132, 82, 101, 120, 85, 96, 100, 82, 90, 103, 140).  NST reactive. AFI 15.73 cm.  Preterm labor symptoms and  general obstetric precautions  were reviewed.  Return in about 4 days (around 09/12/2017) for ROB with NST.  Marcelyn Bruins, CNM 09/08/2017  10:13 AM

## 2017-09-09 NOTE — Progress Notes (Signed)
HROB at 35wk5d/ NST: Baby moving well.  Currently taking 5 U Novolog with each meal and 17 U Lantus All fasting blood sugars meeting goal. Some 2 hr pp elevated, particularly after supper meal (4 of 6 values (range 90-132) Discussed with Dr Jean RosenthalJackson who recommends increasing supper Novolog to 8 U. NST reactive with baseline 135-140 with accelerations to 160s to 170s, moderate variability AFI 6/26 was normal. Last growth scan was 6/12 and was 5#12 oz on 6/18 Repeat NST and AFI on 7/5 Catherine Perez

## 2017-09-10 ENCOUNTER — Encounter: Payer: Self-pay | Admitting: Maternal Newborn

## 2017-09-11 LAB — GC/CHLAMYDIA PROBE AMP (~~LOC~~) NOT AT ARMC
CHLAMYDIA, DNA PROBE: NEGATIVE
NEISSERIA GONORRHEA: NEGATIVE

## 2017-09-11 LAB — STREP GP B CULTURE+RFLX: Strep Gp B Culture+Rflx: NEGATIVE

## 2017-09-13 ENCOUNTER — Ambulatory Visit (INDEPENDENT_AMBULATORY_CARE_PROVIDER_SITE_OTHER): Payer: Managed Care, Other (non HMO) | Admitting: Maternal Newborn

## 2017-09-13 ENCOUNTER — Encounter: Payer: Self-pay | Admitting: Maternal Newborn

## 2017-09-13 VITALS — BP 130/70 | Wt 187.0 lb

## 2017-09-13 DIAGNOSIS — Z3A37 37 weeks gestation of pregnancy: Secondary | ICD-10-CM

## 2017-09-13 DIAGNOSIS — O0992 Supervision of high risk pregnancy, unspecified, second trimester: Secondary | ICD-10-CM

## 2017-09-13 DIAGNOSIS — O24414 Gestational diabetes mellitus in pregnancy, insulin controlled: Secondary | ICD-10-CM

## 2017-09-13 MED ORDER — INSULIN ASPART 100 UNIT/ML FLEXPEN: 5.0000 [IU] | PEN_INJECTOR | SUBCUTANEOUS | 11 refills | Status: DC

## 2017-09-13 NOTE — Progress Notes (Signed)
C/o wants cx ck.rj

## 2017-09-13 NOTE — Patient Instructions (Signed)
Vaginal Delivery Vaginal delivery means that you will give birth by pushing your baby out of your birth canal (vagina). A team of health care providers will help you before, during, and after vaginal delivery. Birth experiences are unique for every woman and every pregnancy, and birth experiences vary depending on where you choose to give birth. What should I do to prepare for my baby's birth? Before your baby is born, it is important to talk with your health care provider about:  Your labor and delivery preferences. These may include: ? Medicines that you may be given. ? How you will manage your pain. This might include non-medical pain relief techniques or injectable pain relief such as epidural analgesia. ? How you and your baby will be monitored during labor and delivery. ? Who may be in the labor and delivery room with you. ? Your feelings about surgical delivery of your baby (cesarean delivery, or C-section) if this becomes necessary. ? Your feelings about receiving donated blood through an IV tube (blood transfusion) if this becomes necessary.  Whether you are able: ? To take pictures or videos of the birth. ? To eat during labor and delivery. ? To move around, walk, or change positions during labor and delivery.  What to expect after your baby is born, such as: ? Whether delayed umbilical cord clamping and cutting is offered. ? Who will care for your baby right after birth. ? Medicines or tests that may be recommended for your baby. ? Whether breastfeeding is supported in your hospital or birth center. ? How long you will be in the hospital or birth center.  How any medical conditions you have may affect your baby or your labor and delivery experience.  To prepare for your baby's birth, you should also:  Attend all of your health care visits before delivery (prenatal visits) as recommended by your health care provider. This is important.  Prepare your home for your baby's  arrival. Make sure that you have: ? Diapers. ? Baby clothing. ? Feeding equipment. ? Safe sleeping arrangements for you and your baby.  Install a car seat in your vehicle. Have your car seat checked by a certified car seat installer to make sure that it is installed safely.  Think about who will help you with your new baby at home for at least the first several weeks after delivery.  What can I expect when I arrive at the birth center or hospital? Once you are in labor and have been admitted into the hospital or birth center, your health care provider may:  Review your pregnancy history and any concerns you have.  Insert an IV tube into one of your veins. This is used to give you fluids and medicines.  Check your blood pressure, pulse, temperature, and heart rate (vital signs).  Check whether your bag of water (amniotic sac) has broken (ruptured).  Talk with you about your birth plan and discuss pain control options.  Monitoring Your health care provider may monitor your contractions (uterine monitoring) and your baby's heart rate (fetal monitoring). You may need to be monitored:  Often, but not continuously (intermittently).  All the time or for long periods at a time (continuously). Continuous monitoring may be needed if: ? You are taking certain medicines, such as medicine to relieve pain or make your contractions stronger. ? You have pregnancy or labor complications.  Monitoring may be done by:  Placing a special stethoscope or a handheld monitoring device on your abdomen to   check your baby's heartbeat, and feeling your abdomen for contractions. This method of monitoring does not continuously record your baby's heartbeat or your contractions.  Placing monitors on your abdomen (external monitors) to record your baby's heartbeat and the frequency and length of contractions. You may not have to wear external monitors all the time.  Placing monitors inside of your uterus  (internal monitors) to record your baby's heartbeat and the frequency, length, and strength of your contractions. ? Your health care provider may use internal monitors if he or she needs more information about the strength of your contractions or your baby's heart rate. ? Internal monitors are put in place by passing a thin, flexible wire through your vagina and into your uterus. Depending on the type of monitor, it may remain in your uterus or on your baby's head until birth. ? Your health care provider will discuss the benefits and risks of internal monitoring with you and will ask for your permission before inserting the monitors.  Telemetry. This is a type of continuous monitoring that can be done with external or internal monitors. Instead of having to stay in bed, you are able to move around during telemetry. Ask your health care provider if telemetry is an option for you.  Physical exam Your health care provider may perform a physical exam. This may include:  Checking whether your baby is positioned: ? With the head toward your vagina (head-down). This is most common. ? With the head toward the top of your uterus (head-up or breech). If your baby is in a breech position, your health care provider may try to turn your baby to a head-down position so you can deliver vaginally. If it does not seem that your baby can be born vaginally, your provider may recommend surgery to deliver your baby. In rare cases, you may be able to deliver vaginally if your baby is head-up (breech delivery). ? Lying sideways (transverse). Babies that are lying sideways cannot be delivered vaginally.  Checking your cervix to determine: ? Whether it is thinning out (effacing). ? Whether it is opening up (dilating). ? How low your baby has moved into your birth canal.  What are the three stages of labor and delivery?  Normal labor and delivery is divided into the following three stages: Stage 1  Stage 1 is the  longest stage of labor, and it can last for hours or days. Stage 1 includes: ? Early labor. This is when contractions may be irregular, or regular and mild. Generally, early labor contractions are more than 10 minutes apart. ? Active labor. This is when contractions get longer, more regular, more frequent, and more intense. ? The transition phase. This is when contractions happen very close together, are very intense, and may last longer than during any other part of labor.  Contractions generally feel mild, infrequent, and irregular at first. They get stronger, more frequent (about every 2-3 minutes), and more regular as you progress from early labor through active labor and transition.  Many women progress through stage 1 naturally, but you may need help to continue making progress. If this happens, your health care provider may talk with you about: ? Rupturing your amniotic sac if it has not ruptured yet. ? Giving you medicine to help make your contractions stronger and more frequent.  Stage 1 ends when your cervix is completely dilated to 4 inches (10 cm) and completely effaced. This happens at the end of the transition phase. Stage 2  Once   your cervix is completely effaced and dilated to 4 inches (10 cm), you may start to feel an urge to push. It is common for the body to naturally take a rest before feeling the urge to push, especially if you received an epidural or certain other pain medicines. This rest period may last for up to 1-2 hours, depending on your unique labor experience.  During stage 2, contractions are generally less painful, because pushing helps relieve contraction pain. Instead of contraction pain, you may feel stretching and burning pain, especially when the widest part of your baby's head passes through the vaginal opening (crowning).  Your health care provider will closely monitor your pushing progress and your baby's progress through the vagina during stage 2.  Your  health care provider may massage the area of skin between your vaginal opening and anus (perineum) or apply warm compresses to your perineum. This helps it stretch as the baby's head starts to crown, which can help prevent perineal tearing. ? In some cases, an incision may be made in your perineum (episiotomy) to allow the baby to pass through the vaginal opening. An episiotomy helps to make the opening of the vagina larger to allow more room for the baby to fit through.  It is very important to breathe and focus so your health care provider can control the delivery of your baby's head. Your health care provider may have you decrease the intensity of your pushing, to help prevent perineal tearing.  After delivery of your baby's head, the shoulders and the rest of the body generally deliver very quickly and without difficulty.  Once your baby is delivered, the umbilical cord may be cut right away, or this may be delayed for 1-2 minutes, depending on your baby's health. This may vary among health care providers, hospitals, and birth centers.  If you and your baby are healthy enough, your baby may be placed on your chest or abdomen to help maintain the baby's temperature and to help you bond with each other. Some mothers and babies start breastfeeding at this time. Your health care team will dry your baby and help keep your baby warm during this time.  Your baby may need immediate care if he or she: ? Showed signs of distress during labor. ? Has a medical condition. ? Was born too early (prematurely). ? Had a bowel movement before birth (meconium). ? Shows signs of difficulty transitioning from being inside the uterus to being outside of the uterus. If you are planning to breastfeed, your health care team will help you begin a feeding. Stage 3  The third stage of labor starts immediately after the birth of your baby and ends after you deliver the placenta. The placenta is an organ that develops  during pregnancy to provide oxygen and nutrients to your baby in the womb.  Delivering the placenta may require some pushing, and you may have mild contractions. Breastfeeding can stimulate contractions to help you deliver the placenta.  After the placenta is delivered, your uterus should tighten (contract) and become firm. This helps to stop bleeding in your uterus. To help your uterus contract and to control bleeding, your health care provider may: ? Give you medicine by injection, through an IV tube, by mouth, or through your rectum (rectally). ? Massage your abdomen or perform a vaginal exam to remove any blood clots that are left in your uterus. ? Empty your bladder by placing a thin, flexible tube (catheter) into your bladder. ? Encourage   you to breastfeed your baby. After labor is over, you and your baby will be monitored closely to ensure that you are both healthy until you are ready to go home. Your health care team will teach you how to care for yourself and your baby. This information is not intended to replace advice given to you by your health care provider. Make sure you discuss any questions you have with your health care provider. Document Released: 12/01/2007 Document Revised: 09/11/2015 Document Reviewed: 03/08/2015 Elsevier Interactive Patient Education  2018 [employer]  

## 2017-09-13 NOTE — Progress Notes (Signed)
Routine Prenatal Care Visit  Subjective  Catherine Perez is a 37 y.o. G2P0010 at [redacted]w[redacted]d being seen today for ongoing prenatal care.  She is currently monitored for the following issues for this high-risk pregnancy and has Advanced maternal age in multigravida, first trimester; Supervision of high risk pregnancy, antepartum, second trimester; Pregnancy with abnormal glucose tolerance test (GTT); Anemia affecting pregnancy in third trimester; and Gestational diabetes mellitus on their problem list.  ----------------------------------------------------------------------------------- Patient reports no complaints.   Contractions: Not present. Vag. Bleeding: None.  Movement: Present. No leaking of fluid.  ----------------------------------------------------------------------------------- The following portions of the patient's history were reviewed and updated as appropriate: allergies, current medications, past family history, past medical history, past social history, past surgical history and problem list. Problem list updated.   Objective  Blood pressure 130/70, weight 187 lb (84.8 kg), last menstrual period 12/28/2016. Pregravid weight 155 lb (70.3 kg) Total Weight Gain 32 lb (14.5 kg) Body mass index is 33.13 kg/m. Urinalysis: Urine Protein: Negative Urine Glucose: Negative  Fetal Status: Fetal Heart Rate (bpm): 145 Fundal Height: 38 cm Movement: Present  Presentation: Vertex  General:  Alert, oriented and cooperative. Patient is in no acute distress.  Skin: Skin is warm and dry. No rash noted.   Cardiovascular: Normal heart rate noted  Respiratory: Normal respiratory effort, no problems with respiration noted  Abdomen: Soft, gravid, appropriate for gestational age. Pain/Pressure: Absent     Pelvic:  Cervical exam performed Dilation: Closed Effacement (%): Thick Station: -3  Extremities: Normal range of motion.  Edema: None  Mental Status: Normal mood and affect. Normal behavior.  Normal judgment and thought content.   NST Baseline: 145 Variability: moderate Accelerations: present Decelerations: absent Tocometry: not done The patient was monitored for 20 minutes, fetal heart rate tracing was deemed reactive.  Assessment   37 y.o. G2P0010 at [redacted]w[redacted]d, EDD 10/04/2017 by Last Menstrual Period presenting for routine prenatal visit.  Plan   pregnancy #2 Problems (from 03/10/17 to present)    Problem Noted Resolved   Gestational diabetes mellitus 08/15/2017 by Oswaldo Conroy, CNM No   Overview Addendum 08/30/2017 10:35 AM by Conard Novak, MD    Current Diabetic Medications:  Insulin lantus 17 units HS, aspart 5 units ac dinner [ ]  Aspirin 81 mg daily after 12 weeks; discontinue after 36 weeks (? A2/B GDM)  Required Referrals for A1GDM or A2GDM: [x ] Diabetes Education and Testing Supplies [x ] Nutrition Cousult  For A2/B GDM or higher classes of DM [ ]  Diabetes Education and Testing Supplies [ ]  Nutrition Counsult [ ]  Fetal ECHO after 22-24 weeks  [ ]  Eye exam for retina evaluation  [ ]  Baseline EKG [ ]  US fetal growth every 4 weeks starting at 28 weeks [ ]  Twice weekly NST starting at [redacted] weeks gestation [ ]  Delivery planning contingent on fetal growth, AFI, glycemic control, and other co-morbidities but at least by 39 weeks  Baseline and surveillance labs (pulled in from [facility], refresh links as needed)  Lab Results  Component Value Date   CREATININE 0.43 (L) 04/16/2017   AST 22 04/16/2017   ALT 27 04/16/2017   No results found for: HGBA1C  Antenatal Testing Class of DM U/S NST/AFI DELIVERY  Diabetes   A1 - good control - O24.410    A2 - good control - O24.419      A2  - poor control or poor compliance - O24.419, E11.65   (Macrosomia or polyhydramnios) **E11.65 is extra  code for poor control**    A2/B - O24.919  and B-C O24.319  Poor control B-C or D-R-F-T - O24.319  or  Type I DM - O24.019   20-38  20-38  20-24-28-32-36   20-24-28-32-35-38//fetal echo  20-24-27-30-33-36-38//fetal echo  40  32//2 x wk  32//2 x wk   32//2 x wk  28//BPP wkly then 32//2 x wk  40  39  PRN   39  PRN         Supervision of high risk pregnancy, antepartum, second trimester 04/24/2017 by Nadara Mustard, MD No   Overview Addendum 08/07/2017  9:33 AM by Natale Milch, MD    [facility] Prenatal Labs  Dating  LMP = 12wk [place] Blood type: A/Positive/-- (01/04 1118)   Genetic Screen  NIPS: Declined genetic screening Antibody:Negative (01/04 1118)  Anatomic [place]  complete- gender is surprise Rubella: 18.00 (01/04 1118) Varicella: Immune  GTT  Third trimester: 185  (3hr, 98/241/197) RPR: Non Reactive (01/04 1118)   Rhogam  not indicated HBsAg: Negative (01/04 1118)   TDaP vaccine   08/01/17                     Flu Shot: HIV: Non Reactive (01/04 1118)   Baby Food  Bottle                              GBS:   Contraception  IUD Pap: 2019 NIL HPV negative  CBB   Given information   CS/VBAC  not applicable   Support Person            Advanced maternal age in multigravida, first trimester 03/10/2017 by Natale Milch, MD No   Overview Signed 03/10/2017 10:49 AM by Natale Milch, MD    [ ]  NIPT offered      Pregnancy, supervision, high-risk, first trimester 03/10/2017 by Natale Milch, MD 06/19/2017 by Oswaldo Conroy, CNM   Overview Signed 03/10/2017 10:50 AM by Natale Milch, MD      [facility] Prenatal Labs  Dating  Blood type:     Genetic Screen 1 Screen:     AFP:      Quad:      NIPS:    Antibody:   Anatomic [place]  Rubella:   Varicella: @VZVIGG @  GTT Early:        28 wk:      RPR:     Rhogam  HBsAg:     TDaP vaccine                       HIV:     Flu Shot   Declines                             GBS:   Contraception  Pap:  CBB     CS/VBAC    Baby Food    Support Person              Reactive NST today.  Glucose log  reviewed. Fasting and after breakfast values are all good. Lunch and dinner postprandial values are elevated. She is currently taking 7 units of Novolog with dinner and 17 units of Lantus at bedtime.   Fasting: 87, 89, 86, 90, 90, 86 Breakfast: 90, 90, 94, 92, 92, 111 Lunch: 120, 124, 130 Dinner: 128, 132, 128,  128  After discussion with Dr. Jean RosenthalJackson, will change insulin therapy to 5 units of Novolog with lunch, 12 units of Novolog with dinner, and 17 units of Lantus at bedtime.  Please refer to After Visit Summary for other counseling recommendations.   Appointment on 7/12 for ROB with NST/AFI.  Return in about 1 week (around 09/20/2017) for ROB with NST.  Marcelyn BruinsJacelyn Dwayn Moravek, CNM 09/13/2017  9:26 AM

## 2017-09-15 ENCOUNTER — Ambulatory Visit (INDEPENDENT_AMBULATORY_CARE_PROVIDER_SITE_OTHER): Payer: Managed Care, Other (non HMO)

## 2017-09-15 ENCOUNTER — Ambulatory Visit (INDEPENDENT_AMBULATORY_CARE_PROVIDER_SITE_OTHER): Payer: Managed Care, Other (non HMO) | Admitting: Obstetrics & Gynecology

## 2017-09-15 VITALS — BP 150/68 | Wt 189.0 lb

## 2017-09-15 DIAGNOSIS — O0992 Supervision of high risk pregnancy, unspecified, second trimester: Secondary | ICD-10-CM

## 2017-09-15 DIAGNOSIS — Z3A37 37 weeks gestation of pregnancy: Secondary | ICD-10-CM | POA: Diagnosis not present

## 2017-09-15 DIAGNOSIS — O09521 Supervision of elderly multigravida, first trimester: Secondary | ICD-10-CM

## 2017-09-15 DIAGNOSIS — O99213 Obesity complicating pregnancy, third trimester: Secondary | ICD-10-CM

## 2017-09-15 DIAGNOSIS — O09523 Supervision of elderly multigravida, third trimester: Secondary | ICD-10-CM

## 2017-09-15 DIAGNOSIS — Z369 Encounter for antenatal screening, unspecified: Secondary | ICD-10-CM

## 2017-09-15 DIAGNOSIS — O24414 Gestational diabetes mellitus in pregnancy, insulin controlled: Secondary | ICD-10-CM

## 2017-09-15 NOTE — Progress Notes (Signed)
  Subjective  Fetal Movement? yes Contractions? no Leaking Fluid? no Vaginal Bleeding? no  Objective  BP (!) 150/68   Wt 189 lb (85.7 kg)   LMP 12/28/2016 (Exact Date)   BMI 33.48 kg/m  General: NAD Pumonary: no increased work of breathing Abdomen: gravid, non-tender Extremities: no edema Psychiatric: mood appropriate, affect full  Assessment  37 y.o. G2P0010 at 4661w2d by  10/04/2017, by Last Menstrual Period presenting for routine prenatal visit  Plan   Problem List Items Addressed This Visit      Endocrine   Gestational diabetes mellitus     Other   Advanced maternal age in multigravida, first trimester   Supervision of high risk pregnancy, antepartum, second trimester    Other Visit Diagnoses    Obesity affecting pregnancy in third trimester    -  Primary   Relevant Orders   US OB Limited   [redacted] weeks gestation of pregnancy        A NST procedure was performed with FHR monitoring and a normal baseline established, appropriate time of 20-40 minutes of evaluation, and accels >2 seen w 15x15 characteristics.  Results show a REACTIVE NST.   Review of ULTRASOUND.    [facility]    Plan of management to be discussed with patient.    AFI 15.  Vtx.  Plans IUD  Annamarie MajorPaul Harris, MD, Merlinda FrederickFACOG [facility], [facility] 09/15/2017  4:21 PM

## 2017-09-20 ENCOUNTER — Ambulatory Visit (INDEPENDENT_AMBULATORY_CARE_PROVIDER_SITE_OTHER): Payer: Managed Care, Other (non HMO) | Admitting: Obstetrics & Gynecology

## 2017-09-20 VITALS — BP 150/80 | Wt 186.0 lb

## 2017-09-20 DIAGNOSIS — Z3A38 38 weeks gestation of pregnancy: Secondary | ICD-10-CM

## 2017-09-20 DIAGNOSIS — O24414 Gestational diabetes mellitus in pregnancy, insulin controlled: Secondary | ICD-10-CM | POA: Diagnosis not present

## 2017-09-20 DIAGNOSIS — O09523 Supervision of elderly multigravida, third trimester: Secondary | ICD-10-CM

## 2017-09-20 DIAGNOSIS — O0992 Supervision of high risk pregnancy, unspecified, second trimester: Secondary | ICD-10-CM

## 2017-09-20 DIAGNOSIS — O09521 Supervision of elderly multigravida, first trimester: Secondary | ICD-10-CM

## 2017-09-20 NOTE — Progress Notes (Signed)
  Subjective  Fetal Movement? yes Contractions? no Leaking Fluid? no Vaginal Bleeding? no Occas h/a, relieved w rest and/or Tylenol BS- FBS all normal; PPBS range mostly <120 w occas 120s, and one that was 170    On Insulin 5U lunchtime, 12U Dinnertime, and Long acting 17 U bedtime Objective  BP (!) 150/80   Wt 186 lb (84.4 kg)   LMP 12/28/2016 (Exact Date)   BMI 32.95 kg/m  Repeat BP 140/82 General: NAD Pumonary: no increased work of breathing Abdomen: gravid, non-tender Extremities: TR edema Psychiatric: mood appropriate, affect full  A NST procedure was performed with FHR monitoring and a normal baseline established, appropriate time of 20-40 minutes of evaluation, and accels >2 seen w 15x15 characteristics.  Results show a REACTIVE NST.   Assessment  37 y.o. G2P0010 at 6633w0d by  10/04/2017, by Last Menstrual Period presenting for routine prenatal visit  Plan   Problem List Items Addressed This Visit      Endocrine   Gestational diabetes mellitus     Other   Advanced maternal age in multigravida, first trimester   Supervision of high risk pregnancy, antepartum, second trimester    Other Visit Diagnoses    [redacted] weeks gestation of pregnancy    -  Primary    Monitor BS and BP Pregnancy risks for DM and Preeclampsia discussed. US Friday w NST (twice weekly APT) IOL discussed, sch for 7/30 at 0500  Annamarie MajorPaul Harris, MD, Merlinda FrederickFACOG [facility], [facility] 09/20/2017  10:06 AM

## 2017-09-20 NOTE — Addendum Note (Signed)
Addended by: Nadara MustardHARRIS, Kynadie Yaun P on: 09/20/2017 11:12 AM   Modules accepted: Orders, SmartSet

## 2017-09-20 NOTE — Progress Notes (Signed)
  [facility] IN[facility]ESS[facility] Catherine SabalSCHEDULING Catherine Perez 03/21/1980 Medical record #: [id number] Phone #:  Home Phone [phone]  Mobile [phone]   Prenatal Provider:[facility] Delivering Group:[facility] Proposed admission date/time:10/03/17 at 0500 Method of induction:Cytotec  Weight: Filed Weights07/17/19 0937Weight:186 lb (84.4 kg) BMI Body mass index is 32.95 kg/m. HIV Negative HSV not done, no history EDC Estimated Date of Delivery: 7/31/19based on:US at [redacted] wks  Gestational age on admission: 39 6/7 Gravidity/parity:G2P0010  Cervix Score   0 1 2 3   Position Posterior Midposition Anterior   Consistency Firm Medium Soft   Effacement (%) 0-30 40-50 60-70 >80  Dilation (cm) Closed 1-2 3-4 >5  Baby's station -3 -2 -1 +1, +2   Bishop Score:3   Medical induction of labor  Medical Indications Adapted from ACOG Committee Opinion #[id number], "Medically Indicated Late Preterm and Early Term Deliveries," 2013.    MATERNAL ISSUES  ?  ?  ? Diabetes  Provider Signature: Letitia Libraobert Paul Gerda Yin Scheduled by:Dr PH/ Mindy T Date:09/20/2017 11:09 AM   Call [phone] to finalize the induction date/time  [id number] (07/17)

## 2017-09-22 ENCOUNTER — Ambulatory Visit (INDEPENDENT_AMBULATORY_CARE_PROVIDER_SITE_OTHER): Payer: Managed Care, Other (non HMO) | Admitting: Advanced Practice Midwife

## 2017-09-22 ENCOUNTER — Encounter: Payer: Self-pay | Admitting: Advanced Practice Midwife

## 2017-09-22 ENCOUNTER — Other Ambulatory Visit: Payer: Managed Care, Other (non HMO)

## 2017-09-22 VITALS — BP 146/80 | Wt 189.0 lb

## 2017-09-22 DIAGNOSIS — O09523 Supervision of elderly multigravida, third trimester: Secondary | ICD-10-CM | POA: Diagnosis not present

## 2017-09-22 DIAGNOSIS — O133 Gestational [pregnancy-induced] hypertension without significant proteinuria, third trimester: Secondary | ICD-10-CM | POA: Diagnosis not present

## 2017-09-22 DIAGNOSIS — Z3A38 38 weeks gestation of pregnancy: Secondary | ICD-10-CM | POA: Diagnosis not present

## 2017-09-22 DIAGNOSIS — O24414 Gestational diabetes mellitus in pregnancy, insulin controlled: Secondary | ICD-10-CM

## 2017-09-22 NOTE — Progress Notes (Signed)
Routine Prenatal Care Visit  Subjective  Catherine Perez is a 37 y.o. G2P0010 at [redacted]w[redacted]d being seen today for ongoing prenatal care.  She is currently monitored for the following issues for this high-risk pregnancy and has Advanced maternal age in multigravida, first trimester; Supervision of high risk pregnancy, antepartum, second trimester; Pregnancy with abnormal glucose tolerance test (GTT); Anemia affecting pregnancy in third trimester; and Gestational diabetes mellitus on their problem list.  ----------------------------------------------------------------------------------- Patient reports pelvic pressure and an increase in vaginal discharge which she describes as milky white. She denies headache, change of vision or epigastric pain. Contractions: Not present. Vag. Bleeding: None.  Movement: Present. Denies leaking of fluid.  ----------------------------------------------------------------------------------- The following portions of the patient's history were reviewed and updated as appropriate: allergies, current medications, past family history, past medical history, past social history, past surgical history and problem list. Problem list updated.   Objective  Blood pressure (!) 146/80, weight 189 lb (85.7 kg), last menstrual period 12/28/2016. Pregravid weight 155 lb (70.3 kg) Total Weight Gain 34 lb (15.4 kg)   Initial blood pressure today 150/90   1 abnormal blood sugar 154 after [place] food dinner last night- otherwise normal values since last visit  Urinalysis: Urine Protein: Trace Urine Glucose: Negative  Fetal Status: Fetal Heart Rate (bpm): 135   Movement: Present  Presentation: Vertex  NST today is reactive 20 minute tracing, baseline 135 bpm, moderate variability, +accelerations, -decelerations Unable to get AFI/growth today due to no ultrasound technician  General:  Alert, oriented and cooperative. Patient is in no acute distress.  Skin: Skin is warm and dry. No  rash noted.   Cardiovascular: Normal heart rate noted  Respiratory: Normal respiratory effort, no problems with respiration noted  Abdomen: Soft, gravid, appropriate for gestational age. Pain/Pressure: Present     Pelvic:  Cervical exam performed Dilation: Closed Effacement (%): Thick Station: -2 no evidence of amniotic fluid, negative nitrazine  Extremities: Normal range of motion.  Edema: Trace  Mental Status: Normal mood and affect. Normal behavior. Normal judgment and thought content.   Assessment   37 y.o. G2P0010 at [redacted]w[redacted]d by  10/04/2017, by Last Menstrual Period presenting for routine prenatal visit  Plan   pregnancy #2 Problems (from 03/10/17 to present)    Problem Noted Resolved   Gestational diabetes mellitus 08/15/2017 by Oswaldo Conroy, CNM No   Overview Addendum 09/13/2017  9:30 AM by Oswaldo Conroy, CNM    Current Diabetic Medications:  Insulin lantus 17 units HS, aspart 5 units ac lunch and 12 units ac dinner (09/13/17) [ ]  Aspirin 81 mg daily after 12 weeks; discontinue after 36 weeks (? A2/B GDM)  Required Referrals for A1GDM or A2GDM: [x ] Diabetes Education and Testing Supplies [x ] Nutrition Cousult  For A2/B GDM or higher classes of DM [ ]  Diabetes Education and Testing Supplies [ ]  Nutrition Counsult [ ]  Fetal ECHO after 22-24 weeks  [ ]  Eye exam for retina evaluation  [ ]  Baseline EKG [ ]  US fetal growth every 4 weeks starting at 28 weeks [ ]  Twice weekly NST starting at [redacted] weeks gestation [ ]  Delivery planning contingent on fetal growth, AFI, glycemic control, and other co-morbidities but at least by 39 weeks  Baseline and surveillance labs (pulled in from [facility], refresh links as needed)  Lab Results  Component Value Date   CREATININE 0.43 (L) 04/16/2017   AST 22 04/16/2017   ALT 27 04/16/2017   No results found for: HGBA1C  Antenatal  Testing Class of DM U/S NST/AFI DELIVERY  Diabetes   A1 - good control - O24.410    A2 - good control - O24.419       A2  - poor control or poor compliance - O24.419, E11.65   (Macrosomia or polyhydramnios) **E11.65 is extra code for poor control**    A2/B - O24.919  and B-C O24.319  Poor control B-C or D-R-F-T - O24.319  or  Type I DM - O24.019  20-38  20-38  20-24-28-32-36   20-24-28-32-35-38//fetal echo  20-24-27-30-33-36-38//fetal echo  40  32//2 x wk  32//2 x wk   32//2 x wk  28//BPP wkly then 32//2 x wk  40  39  PRN   39  PRN         Supervision of high risk pregnancy, antepartum, second trimester 04/24/2017 by Nadara Mustard, MD No   Overview Addendum 08/07/2017  9:33 AM by Natale Milch, MD    [facility] Prenatal Labs  Dating  LMP = 12wk [place] Blood type: A/Positive/-- (01/04 1118)   Genetic Screen  NIPS: Declined genetic screening Antibody:Negative (01/04 1118)  Anatomic [place]  complete- gender is surprise Rubella: 18.00 (01/04 1118) Varicella: Immune  GTT  Third trimester: 185  (3hr, 98/241/197) RPR: Non Reactive (01/04 1118)   Rhogam  not indicated HBsAg: Negative (01/04 1118)   TDaP vaccine   08/01/17                     Flu Shot: HIV: Non Reactive (01/04 1118)   Baby Food  Bottle                              GBS:   Contraception  IUD Pap: 2019 NIL HPV negative  CBB   Given information   CS/VBAC  not applicable   Support Person            Advanced maternal age in multigravida, first trimester 03/10/2017 by Natale Milch, MD No   Overview Signed 03/10/2017 10:49 AM by Natale Milch, MD    [ ]  NIPT offered      Pregnancy, supervision, high-risk, first trimester 03/10/2017 by Natale Milch, MD 06/19/2017 by Oswaldo Conroy, CNM   Overview Signed 03/10/2017 10:50 AM by Natale Milch, MD      [facility] Prenatal Labs  Dating  Blood type:     Genetic Screen 1 Screen:     AFP:      Quad:      NIPS:    Antibody:   Anatomic [place]  Rubella:   Varicella: @VZVIGG @  GTT Early:        28 wk:      RPR:     Rhogam   HBsAg:     TDaP vaccine                       HIV:     Flu Shot   Declines                             GBS:   Contraception  Pap:  CBB     CS/VBAC    Baby Food    Support Person                CBC, CMP and UPC today  Term labor symptoms and general  obstetric precautions including but not limited to vaginal bleeding, contractions, leaking of fluid and fetal movement were reviewed in detail with the patient. Please refer to After Visit Summary for other counseling recommendations.   Return for has follow up scheduled/needs growth and afi at next visit.  Tresea MallJane Damien Batty, CNM 09/22/2017 10:09 AM

## 2017-09-22 NOTE — Progress Notes (Signed)
ROB/NST C/o a lot of pressure/ yesterday started leaking "milky white fluid" pt stated running down legs Some braxton hicks last night

## 2017-09-22 NOTE — Patient Instructions (Signed)
Vaginal Delivery Vaginal delivery means that you will give birth by pushing your baby out of your birth canal (vagina). A team of health care providers will help you before, during, and after vaginal delivery. Birth experiences are unique for every woman and every pregnancy, and birth experiences vary depending on where you choose to give birth. What should I do to prepare for my baby's birth? Before your baby is born, it is important to talk with your health care provider about:  Your labor and delivery preferences. These may include: ? Medicines that you may be given. ? How you will manage your pain. This might include non-medical pain relief techniques or injectable pain relief such as epidural analgesia. ? How you and your baby will be monitored during labor and delivery. ? Who may be in the labor and delivery room with you. ? Your feelings about surgical delivery of your baby (cesarean delivery, or C-section) if this becomes necessary. ? Your feelings about receiving donated blood through an IV tube (blood transfusion) if this becomes necessary.  Whether you are able: ? To take pictures or videos of the birth. ? To eat during labor and delivery. ? To move around, walk, or change positions during labor and delivery.  What to expect after your baby is born, such as: ? Whether delayed umbilical cord clamping and cutting is offered. ? Who will care for your baby right after birth. ? Medicines or tests that may be recommended for your baby. ? Whether breastfeeding is supported in your hospital or birth center. ? How long you will be in the hospital or birth center.  How any medical conditions you have may affect your baby or your labor and delivery experience.  To prepare for your baby's birth, you should also:  Attend all of your health care visits before delivery (prenatal visits) as recommended by your health care provider. This is important.  Prepare your home for your baby's  arrival. Make sure that you have: ? Diapers. ? Baby clothing. ? Feeding equipment. ? Safe sleeping arrangements for you and your baby.  Install a car seat in your vehicle. Have your car seat checked by a certified car seat installer to make sure that it is installed safely.  Think about who will help you with your new baby at home for at least the first several weeks after delivery.  What can I expect when I arrive at the birth center or hospital? Once you are in labor and have been admitted into the hospital or birth center, your health care provider may:  Review your pregnancy history and any concerns you have.  Insert an IV tube into one of your veins. This is used to give you fluids and medicines.  Check your blood pressure, pulse, temperature, and heart rate (vital signs).  Check whether your bag of water (amniotic sac) has broken (ruptured).  Talk with you about your birth plan and discuss pain control options.  Monitoring Your health care provider may monitor your contractions (uterine monitoring) and your baby's heart rate (fetal monitoring). You may need to be monitored:  Often, but not continuously (intermittently).  All the time or for long periods at a time (continuously). Continuous monitoring may be needed if: ? You are taking certain medicines, such as medicine to relieve pain or make your contractions stronger. ? You have pregnancy or labor complications.  Monitoring may be done by:  Placing a special stethoscope or a handheld monitoring device on your abdomen to   check your baby's heartbeat, and feeling your abdomen for contractions. This method of monitoring does not continuously record your baby's heartbeat or your contractions.  Placing monitors on your abdomen (external monitors) to record your baby's heartbeat and the frequency and length of contractions. You may not have to wear external monitors all the time.  Placing monitors inside of your uterus  (internal monitors) to record your baby's heartbeat and the frequency, length, and strength of your contractions. ? Your health care provider may use internal monitors if he or she needs more information about the strength of your contractions or your baby's heart rate. ? Internal monitors are put in place by passing a thin, flexible wire through your vagina and into your uterus. Depending on the type of monitor, it may remain in your uterus or on your baby's head until birth. ? Your health care provider will discuss the benefits and risks of internal monitoring with you and will ask for your permission before inserting the monitors.  Telemetry. This is a type of continuous monitoring that can be done with external or internal monitors. Instead of having to stay in bed, you are able to move around during telemetry. Ask your health care provider if telemetry is an option for you.  Physical exam Your health care provider may perform a physical exam. This may include:  Checking whether your baby is positioned: ? With the head toward your vagina (head-down). This is most common. ? With the head toward the top of your uterus (head-up or breech). If your baby is in a breech position, your health care provider may try to turn your baby to a head-down position so you can deliver vaginally. If it does not seem that your baby can be born vaginally, your provider may recommend surgery to deliver your baby. In rare cases, you may be able to deliver vaginally if your baby is head-up (breech delivery). ? Lying sideways (transverse). Babies that are lying sideways cannot be delivered vaginally.  Checking your cervix to determine: ? Whether it is thinning out (effacing). ? Whether it is opening up (dilating). ? How low your baby has moved into your birth canal.  What are the three stages of labor and delivery?  Normal labor and delivery is divided into the following three stages: Stage 1  Stage 1 is the  longest stage of labor, and it can last for hours or days. Stage 1 includes: ? Early labor. This is when contractions may be irregular, or regular and mild. Generally, early labor contractions are more than 10 minutes apart. ? Active labor. This is when contractions get longer, more regular, more frequent, and more intense. ? The transition phase. This is when contractions happen very close together, are very intense, and may last longer than during any other part of labor.  Contractions generally feel mild, infrequent, and irregular at first. They get stronger, more frequent (about every 2-3 minutes), and more regular as you progress from early labor through active labor and transition.  Many women progress through stage 1 naturally, but you may need help to continue making progress. If this happens, your health care provider may talk with you about: ? Rupturing your amniotic sac if it has not ruptured yet. ? Giving you medicine to help make your contractions stronger and more frequent.  Stage 1 ends when your cervix is completely dilated to 4 inches (10 cm) and completely effaced. This happens at the end of the transition phase. Stage 2  Once   your cervix is completely effaced and dilated to 4 inches (10 cm), you may start to feel an urge to push. It is common for the body to naturally take a rest before feeling the urge to push, especially if you received an epidural or certain other pain medicines. This rest period may last for up to 1-2 hours, depending on your unique labor experience.  During stage 2, contractions are generally less painful, because pushing helps relieve contraction pain. Instead of contraction pain, you may feel stretching and burning pain, especially when the widest part of your baby's head passes through the vaginal opening (crowning).  Your health care provider will closely monitor your pushing progress and your baby's progress through the vagina during stage 2.  Your  health care provider may massage the area of skin between your vaginal opening and anus (perineum) or apply warm compresses to your perineum. This helps it stretch as the baby's head starts to crown, which can help prevent perineal tearing. ? In some cases, an incision may be made in your perineum (episiotomy) to allow the baby to pass through the vaginal opening. An episiotomy helps to make the opening of the vagina larger to allow more room for the baby to fit through.  It is very important to breathe and focus so your health care provider can control the delivery of your baby's head. Your health care provider may have you decrease the intensity of your pushing, to help prevent perineal tearing.  After delivery of your baby's head, the shoulders and the rest of the body generally deliver very quickly and without difficulty.  Once your baby is delivered, the umbilical cord may be cut right away, or this may be delayed for 1-2 minutes, depending on your baby's health. This may vary among health care providers, hospitals, and birth centers.  If you and your baby are healthy enough, your baby may be placed on your chest or abdomen to help maintain the baby's temperature and to help you bond with each other. Some mothers and babies start breastfeeding at this time. Your health care team will dry your baby and help keep your baby warm during this time.  Your baby may need immediate care if he or she: ? Showed signs of distress during labor. ? Has a medical condition. ? Was born too early (prematurely). ? Had a bowel movement before birth (meconium). ? Shows signs of difficulty transitioning from being inside the uterus to being outside of the uterus. If you are planning to breastfeed, your health care team will help you begin a feeding. Stage 3  The third stage of labor starts immediately after the birth of your baby and ends after you deliver the placenta. The placenta is an organ that develops  during pregnancy to provide oxygen and nutrients to your baby in the womb.  Delivering the placenta may require some pushing, and you may have mild contractions. Breastfeeding can stimulate contractions to help you deliver the placenta.  After the placenta is delivered, your uterus should tighten (contract) and become firm. This helps to stop bleeding in your uterus. To help your uterus contract and to control bleeding, your health care provider may: ? Give you medicine by injection, through an IV tube, by mouth, or through your rectum (rectally). ? Massage your abdomen or perform a vaginal exam to remove any blood clots that are left in your uterus. ? Empty your bladder by placing a thin, flexible tube (catheter) into your bladder. ? Encourage   you to breastfeed your baby. After labor is over, you and your baby will be monitored closely to ensure that you are both healthy until you are ready to go home. Your health care team will teach you how to care for yourself and your baby. This information is not intended to replace advice given to you by your health care provider. Make sure you discuss any questions you have with your health care provider. Document Released: 12/01/2007 Document Revised: 09/11/2015 Document Reviewed: 03/08/2015 Elsevier Interactive Patient Education  2018 [employer]  

## 2017-09-23 LAB — CBC WITH DIFFERENTIAL/PLATELET
BASOS ABS: 0 10*3/uL (ref 0.0–0.2)
Basos: 0 %
EOS (ABSOLUTE): 0.2 10*3/uL (ref 0.0–0.4)
Eos: 1 %
HEMOGLOBIN: 13.1 g/dL (ref 11.1–15.9)
Hematocrit: 38.8 % (ref 34.0–46.6)
Immature Grans (Abs): 0.1 10*3/uL (ref 0.0–0.1)
Immature Granulocytes: 1 %
LYMPHS ABS: 2.4 10*3/uL (ref 0.7–3.1)
LYMPHS: 18 %
MCH: 30.1 pg (ref 26.6–33.0)
MCHC: 33.8 g/dL (ref 31.5–35.7)
MCV: 89 fL (ref 79–97)
MONOCYTES: 9 %
Monocytes Absolute: 1.2 10*3/uL — ABNORMAL HIGH (ref 0.1–0.9)
Neutrophils Absolute: 9.4 10*3/uL — ABNORMAL HIGH (ref 1.4–7.0)
Neutrophils: 71 %
Platelets: 339 10*3/uL (ref 150–450)
RBC: 4.35 x10E6/uL (ref 3.77–5.28)
RDW: 14.1 % (ref 12.3–15.4)
WBC: 13.2 10*3/uL — AB (ref 3.4–10.8)

## 2017-09-23 LAB — COMPREHENSIVE METABOLIC PANEL
ALBUMIN: 3.4 g/dL — AB (ref 3.5–5.5)
ALK PHOS: 107 IU/L (ref 39–117)
ALT: 20 IU/L (ref 0–32)
AST: 17 IU/L (ref 0–40)
Albumin/Globulin Ratio: 1.4 (ref 1.2–2.2)
BUN / CREAT RATIO: 16 (ref 9–23)
BUN: 10 mg/dL (ref 6–20)
CHLORIDE: 104 mmol/L (ref 96–106)
CO2: 18 mmol/L — AB (ref 20–29)
CREATININE: 0.61 mg/dL (ref 0.57–1.00)
Calcium: 9.3 mg/dL (ref 8.7–10.2)
GFR calc Af Amer: 136 mL/min/{1.73_m2} (ref >59)
GFR calc non Af Amer: 118 mL/min/{1.73_m2} (ref >59)
GLUCOSE: 77 mg/dL (ref 65–99)
Globulin, Total: 2.4 g/dL (ref 1.5–4.5)
Potassium: 4.4 mmol/L (ref 3.5–5.2)
Sodium: 139 mmol/L (ref 134–144)
Total Protein: 5.8 g/dL — ABNORMAL LOW (ref 6.0–8.5)

## 2017-09-25 ENCOUNTER — Ambulatory Visit (INDEPENDENT_AMBULATORY_CARE_PROVIDER_SITE_OTHER): Payer: Managed Care, Other (non HMO)

## 2017-09-25 ENCOUNTER — Other Ambulatory Visit: Payer: Self-pay | Admitting: Obstetrics & Gynecology

## 2017-09-25 ENCOUNTER — Ambulatory Visit (INDEPENDENT_AMBULATORY_CARE_PROVIDER_SITE_OTHER): Payer: Managed Care, Other (non HMO) | Admitting: Obstetrics & Gynecology

## 2017-09-25 VITALS — BP 138/80 | Wt 190.0 lb

## 2017-09-25 DIAGNOSIS — O24414 Gestational diabetes mellitus in pregnancy, insulin controlled: Secondary | ICD-10-CM

## 2017-09-25 DIAGNOSIS — O09521 Supervision of elderly multigravida, first trimester: Secondary | ICD-10-CM

## 2017-09-25 DIAGNOSIS — Z3A35 35 weeks gestation of pregnancy: Secondary | ICD-10-CM

## 2017-09-25 DIAGNOSIS — O99213 Obesity complicating pregnancy, third trimester: Secondary | ICD-10-CM

## 2017-09-25 DIAGNOSIS — O0992 Supervision of high risk pregnancy, unspecified, second trimester: Secondary | ICD-10-CM

## 2017-09-25 DIAGNOSIS — Z3A38 38 weeks gestation of pregnancy: Secondary | ICD-10-CM | POA: Diagnosis not present

## 2017-09-25 NOTE — Progress Notes (Signed)
  Subjective  Fetal Movement? yes Contractions? no Leaking Fluid? no Vaginal Bleeding? no No ha, blurry vision, edema, epigastric pain BS all WNL this weekend, taking Insulin Objective  BP 138/80   Wt 190 lb (86.2 kg)   LMP 12/28/2016 (Exact Date)   BMI 33.66 kg/m  General: NAD Pumonary: no increased work of breathing Abdomen: gravid, non-tender Extremities: no edema Psychiatric: mood appropriate, affect full  Assessment  37 y.o. G2P0010 at 7239w5d by  10/04/2017, by Last Menstrual Period presenting for routine prenatal visit  Plan    Gestational diabetes mellitus   Relevant Orders   US OB Limited- cont weekly AFI Todays US discussed- Growth discussed, no signs of macrosomia, difficult to assess accurately fetal weight today due to fetal positioning    Advanced maternal age in multigravida, first trimester   Supervision of high risk pregnancy, antepartum, second trimester   5038 weeks gestation of pregnancy    -  Primary    IOL sch for July 30 am    A NST procedure was performed with FHR monitoring and a normal baseline established, appropriate time of 20-40 minutes of evaluation, and accels >2 seen w 15x15 characteristics.  Results show a REACTIVE NST.   Review of ULTRASOUND.    [facility] have personally reviewed images and [facility]    Plan of management to be discussed with patient.    AFI 12  Annamarie MajorPaul Khristian Phillippi, MD, Merlinda FrederickFACOG [facility], [facility] 09/25/2017  10:43 AM

## 2017-09-26 ENCOUNTER — Encounter: Payer: Managed Care, Other (non HMO) | Admitting: Certified Nurse Midwife

## 2017-09-28 ENCOUNTER — Encounter: Payer: Self-pay | Admitting: Obstetrics and Gynecology

## 2017-09-28 ENCOUNTER — Ambulatory Visit (INDEPENDENT_AMBULATORY_CARE_PROVIDER_SITE_OTHER): Payer: Managed Care, Other (non HMO) | Admitting: Obstetrics and Gynecology

## 2017-09-28 VITALS — BP 128/82 | Wt 190.5 lb

## 2017-09-28 DIAGNOSIS — O0992 Supervision of high risk pregnancy, unspecified, second trimester: Secondary | ICD-10-CM

## 2017-09-28 DIAGNOSIS — Z3A39 39 weeks gestation of pregnancy: Secondary | ICD-10-CM | POA: Diagnosis not present

## 2017-09-28 DIAGNOSIS — O09521 Supervision of elderly multigravida, first trimester: Secondary | ICD-10-CM | POA: Diagnosis not present

## 2017-09-28 DIAGNOSIS — O99013 Anemia complicating pregnancy, third trimester: Secondary | ICD-10-CM

## 2017-09-28 DIAGNOSIS — O24414 Gestational diabetes mellitus in pregnancy, insulin controlled: Secondary | ICD-10-CM | POA: Diagnosis not present

## 2017-09-28 NOTE — Progress Notes (Incomplete)
Routine Prenatal Care Visit  Subjective  Catherine Perez is a 37 y.o. G2P0010 at [redacted]w[redacted]d being seen today for ongoing prenatal care.  She is currently monitored for the following issues for this {Blank single:19197::"high-risk","low-risk"} pregnancy and has Advanced maternal age in multigravida, first trimester; Supervision of high risk pregnancy, antepartum, second trimester; Pregnancy with abnormal glucose tolerance test (GTT); Anemia affecting pregnancy in third trimester; and Gestational diabetes mellitus on their problem list.  ----------------------------------------------------------------------------------- Patient reports {sx:[id number]}.   Contractions: Not present. Vag. Bleeding: None.  Movement: Present. Denies leaking of fluid.  ----------------------------------------------------------------------------------- The following portions of the patient's history were reviewed and updated as appropriate: allergies, current medications, past family history, past medical history, past social history, past surgical history and problem list. Problem list updated.   Objective  Blood pressure 128/82, weight 190 lb 8 oz (86.4 kg), last menstrual period 12/28/2016. Pregravid weight 155 lb (70.3 kg) Total Weight Gain 35 lb 8 oz (16.1 kg) Urinalysis: Urine Protein: Negative Urine Glucose: Negative  Fetal Status: Fetal Heart Rate (bpm): 140   Movement: Present  Presentation: Vertex  General:  Alert, oriented and cooperative. Patient is in no acute distress.  Skin: Skin is warm and dry. No rash noted.   Cardiovascular: Normal heart rate noted  Respiratory: Normal respiratory effort, no problems with respiration noted  Abdomen: Soft, gravid, appropriate for gestational age. Pain/Pressure: Absent     Pelvic:  {Blank single:19197::"Cervical exam performed","Cervical exam deferred"} Dilation: Closed Effacement (%): Thick Station: -3  Extremities: Normal range of motion.  Edema: Trace  Mental Status:  Normal mood and affect. Normal behavior. Normal judgment and thought content.   Assessment   37 y.o. G2P0010 at [redacted]w[redacted]d by  10/04/2017, by Last Menstrual Period presenting for {Blank single:19197::"routine","work-in"} prenatal visit  Plan   pregnancy #2 Problems (from 03/10/17 to present)    Problem Noted Resolved   Gestational diabetes mellitus 08/15/2017 by Oswaldo Conroy, CNM No   Overview Addendum 09/13/2017  9:30 AM by Oswaldo Conroy, CNM    Current Diabetic Medications:  Insulin lantus 17 units HS, aspart 5 units ac lunch and 12 units ac dinner (09/13/17) [ ]  Aspirin 81 mg daily after 12 weeks; discontinue after 36 weeks (? A2/B GDM)  Required Referrals for A1GDM or A2GDM: [x ] Diabetes Education and Testing Supplies [x ] Nutrition Cousult  For A2/B GDM or higher classes of DM [ ]  Diabetes Education and Testing Supplies [ ]  Nutrition Counsult [ ]  Fetal ECHO after 22-24 weeks  [ ]  Eye exam for retina evaluation  [ ]  Baseline EKG [ ]  US fetal growth every 4 weeks starting at 28 weeks [ ]  Twice weekly NST starting at [redacted] weeks gestation [ ]  Delivery planning contingent on fetal growth, AFI, glycemic control, and other co-morbidities but at least by 39 weeks  Baseline and surveillance labs (pulled in from [facility], refresh links as needed)  Lab Results  Component Value Date   CREATININE 0.43 (L) 04/16/2017   AST 22 04/16/2017   ALT 27 04/16/2017   No results found for: HGBA1C  Antenatal Testing Class of DM U/S NST/AFI DELIVERY  Diabetes   A1 - good control - O24.410    A2 - good control - O24.419      A2  - poor control or poor compliance - O24.419, E11.65   (Macrosomia or polyhydramnios) **E11.65 is extra code for poor control**    A2/B - O24.919  and B-C O24.319  Poor control B-C or D-R-F-T -  O24.319  or  Type I DM - O24.019  20-38  20-38  20-24-28-32-36   20-24-28-32-35-38//fetal echo  20-24-27-30-33-36-38//fetal echo  40  32//2 x wk  32//2 x  wk   32//2 x wk  28//BPP wkly then 32//2 x wk  40  39  PRN   39  PRN         Supervision of high risk pregnancy, antepartum, second trimester 04/24/2017 by Nadara MustardHarris, Robert P, MD No   Overview Addendum 08/07/2017  9:33 AM by Natale MilchSchuman, Christanna R, MD    [facility] Prenatal Labs  Dating  LMP = 12wk US Blood type: A/Positive/-- (01/04 1118)   Genetic Screen  NIPS: Declined genetic screening Antibody:Negative (01/04 1118)  Anatomic US  complete- gender is surprise Rubella: 18.00 (01/04 1118) Varicella: Immune  GTT  Third trimester: 185  (3hr, 98/241/197) RPR: Non Reactive (01/04 1118)   Rhogam  not indicated HBsAg: Negative (01/04 1118)   TDaP vaccine   08/01/17                     Flu Shot: HIV: Non Reactive (01/04 1118)   Baby Food  Bottle                              GBS:   Contraception  IUD Pap: 2019 NIL HPV negative  CBB   Given information   CS/VBAC  not applicable   Support Person            Advanced maternal age in multigravida, first trimester 03/10/2017 by Natale MilchSchuman, Christanna R, MD No   Overview Signed 03/10/2017 10:49 AM by Natale MilchSchuman, Christanna R, MD    [ ]  NIPT offered      Pregnancy, supervision, high-risk, first trimester 03/10/2017 by Natale MilchSchuman, Christanna R, MD 06/19/2017 by Oswaldo ConroySchmid, Jacelyn Y, CNM   Overview Signed 03/10/2017 10:50 AM by Natale MilchSchuman, Christanna R, MD      [facility] Prenatal Labs  Dating  Blood type:     Genetic Screen 1 Screen:     AFP:      Quad:      NIPS:    Antibody:   Anatomic US  Rubella:   Varicella: @VZVIGG @  GTT Early:        28 wk:      RPR:     Rhogam  HBsAg:     TDaP vaccine                       HIV:     Flu Shot   Declines                             GBS:   Contraception  Pap:  CBB     CS/VBAC    Baby Food    Support Person                 {Blank single:19197::"Term","Preterm"} labor symptoms and general obstetric precautions including but not limited to vaginal bleeding, contractions, leaking of fluid and  fetal movement were reviewed in detail with the patient. Please refer to After Visit Summary for other counseling recommendations.   Return in about 4 days (around 10/02/2017) for Keep previously scheduled appointment.  Thomasene MohairStephen Nikky Duba, MD, Merlinda FrederickFACOG [facility], [facility] 09/28/2017 8:50 AM

## 2017-09-28 NOTE — Progress Notes (Signed)
Routine Prenatal Care Visit  Subjective  Catherine Perez is a 37 y.o. G2P0010 at [redacted]w[redacted]d being seen today for ongoing prenatal care.  She is currently monitored for the following issues for this high-risk pregnancy and has Advanced maternal age in multigravida, first trimester; Supervision of high risk pregnancy, antepartum, second trimester; Pregnancy with abnormal glucose tolerance test (GTT); Anemia affecting pregnancy in third trimester; and Gestational diabetes mellitus on their problem list.  ----------------------------------------------------------------------------------- Patient reports no complaints.   Contractions: Not present. Vag. Bleeding: None.  Movement: Present. Denies leaking of fluid.  GDM: All vales normal since last visit    ----------------------------------------------------------------------------------- The following portions of the patient's history were reviewed and updated as appropriate: allergies, current medications, past family history, past medical history, past social history, past surgical history and problem list. Problem list updated.   Objective  Blood pressure 128/82, weight 190 lb 8 oz (86.4 kg), last menstrual period 12/28/2016. Pregravid weight 155 lb (70.3 kg) Total Weight Gain 35 lb 8 oz (16.1 kg) Urinalysis: Urine Protein: Negative Urine Glucose: Negative  Fetal Status: Fetal Heart Rate (bpm): 140   Movement: Present  Presentation: Vertex  General:  Alert, oriented and cooperative. Patient is in no acute distress.  Skin: Skin is warm and dry. No rash noted.   Cardiovascular: Normal heart rate noted  Respiratory: Normal respiratory effort, no problems with respiration noted  Abdomen: Soft, gravid, appropriate for gestational age. Pain/Pressure: Absent     Pelvic:  Cervical exam performed Dilation: Closed Effacement (%): Thick Station: -3  Extremities: Normal range of motion.  Edema: Trace  Mental Status: Normal mood and affect. Normal  behavior. Normal judgment and thought content.   NST Baseline FHR: 140 beats/min Variability: moderate Accelerations: present Decelerations: absent Tocometry: not performed  Interpretation:  INDICATIONS: gestational diabetes mellitus RESULTS:  A NST procedure was performed with FHR monitoring and a normal baseline established, appropriate time of 20-40 minutes of evaluation, and accels >2 seen w 15x15 characteristics.  Results show a REACTIVE NST.    Assessment   37 y.o. G2P0010 at [redacted]w[redacted]d by  10/04/2017, by Last Menstrual Period presenting for routine prenatal visit  Plan   pregnancy #2 Problems (from 03/10/17 to present)    Problem Noted Resolved   Gestational diabetes mellitus 08/15/2017 by Oswaldo Conroy, CNM No   Overview Addendum 09/13/2017  9:30 AM by Oswaldo Conroy, CNM    Current Diabetic Medications:  Insulin lantus 17 units HS, aspart 5 units ac lunch and 12 units ac dinner (09/13/17) [ ]  Aspirin 81 mg daily after 12 weeks; discontinue after 36 weeks (? A2/B GDM)  Required Referrals for A1GDM or A2GDM: [x ] Diabetes Education and Testing Supplies [x ] Nutrition Cousult  For A2/B GDM or higher classes of DM [ ]  Diabetes Education and Testing Supplies [ ]  Nutrition Counsult [ ]  Fetal ECHO after 22-24 weeks  [ ]  Eye exam for retina evaluation  [ ]  Baseline EKG [ ]  US fetal growth every 4 weeks starting at 28 weeks [ ]  Twice weekly NST starting at [redacted] weeks gestation [ ]  Delivery planning contingent on fetal growth, AFI, glycemic control, and other co-morbidities but at least by 39 weeks  Baseline and surveillance labs (pulled in from [facility], refresh links as needed)  Lab Results  Component Value Date   CREATININE 0.43 (L) 04/16/2017   AST 22 04/16/2017   ALT 27 04/16/2017   No results found for: HGBA1C  Antenatal Testing Class of DM U/S NST/AFI  DELIVERY  Diabetes   A1 - good control - O24.410    A2 - good control - O24.419      A2  - poor control or poor  compliance - O24.419, E11.65   (Macrosomia or polyhydramnios) **E11.65 is extra code for poor control**    A2/B - O24.919  and B-C O24.319  Poor control B-C or D-R-F-T - O24.319  or  Type I DM - O24.019  20-38  20-38  20-24-28-32-36   20-24-28-32-35-38//fetal echo  20-24-27-30-33-36-38//fetal echo  40  32//2 x wk  32//2 x wk   32//2 x wk  28//BPP wkly then 32//2 x wk  40  39  PRN   39  PRN         Supervision of high risk pregnancy, antepartum, second trimester 04/24/2017 by Nadara MustardHarris, Robert P, MD No   Overview Addendum 08/07/2017  9:33 AM by Natale MilchSchuman, Christanna R, MD    [facility] Prenatal Labs  Dating  LMP = 12wk US Blood type: A/Positive/-- (01/04 1118)   Genetic Screen  NIPS: Declined genetic screening Antibody:Negative (01/04 1118)  Anatomic US  complete- gender is surprise Rubella: 18.00 (01/04 1118) Varicella: Immune  GTT  Third trimester: 185  (3hr, 98/241/197) RPR: Non Reactive (01/04 1118)   Rhogam  not indicated HBsAg: Negative (01/04 1118)   TDaP vaccine   08/01/17                     Flu Shot: HIV: Non Reactive (01/04 1118)   Baby Food  Bottle                              GBS:   Contraception  IUD Pap: 2019 NIL HPV negative  CBB   Given information   CS/VBAC  not applicable   Support Person            Advanced maternal age in multigravida, first trimester 03/10/2017 by Natale MilchSchuman, Christanna R, MD No   Overview Signed 03/10/2017 10:49 AM by Natale MilchSchuman, Christanna R, MD    [ ]  NIPT offered      Pregnancy, supervision, high-risk, first trimester 03/10/2017 by Natale MilchSchuman, Christanna R, MD 06/19/2017 by Oswaldo ConroySchmid, Jacelyn Y, CNM   Overview Signed 03/10/2017 10:50 AM by Natale MilchSchuman, Christanna R, MD      [facility] Prenatal Labs  Dating  Blood type:     Genetic Screen 1 Screen:     AFP:      Quad:      NIPS:    Antibody:   Anatomic US  Rubella:   Varicella: @VZVIGG @  GTT Early:        28 wk:      RPR:     Rhogam  HBsAg:     TDaP vaccine                        HIV:     Flu Shot   Declines                             GBS:   Contraception  Pap:  CBB     CS/VBAC    Baby Food    Support Person                 Term labor symptoms and general obstetric precautions including but not limited to vaginal bleeding, contractions, leaking  of fluid and fetal movement were reviewed in detail with the patient. Please refer to After Visit Summary for other counseling recommendations.   -GDM: Continue current dose of insulin. - strict precautions given - monitor BPs at home. Elevated at several consecutive visits. However, normal since then. - NST reactive today.  Return in about 4 days (around 10/02/2017) for Keep previously scheduled appointment.  Thomasene Mohair, MD, [facility], [facility] 09/28/2017 8:50 AM

## 2017-10-02 ENCOUNTER — Ambulatory Visit (INDEPENDENT_AMBULATORY_CARE_PROVIDER_SITE_OTHER): Payer: Managed Care, Other (non HMO) | Admitting: Obstetrics & Gynecology

## 2017-10-02 ENCOUNTER — Ambulatory Visit (INDEPENDENT_AMBULATORY_CARE_PROVIDER_SITE_OTHER): Payer: Managed Care, Other (non HMO)

## 2017-10-02 VITALS — BP 130/80 | Wt 191.0 lb

## 2017-10-02 DIAGNOSIS — O09523 Supervision of elderly multigravida, third trimester: Secondary | ICD-10-CM | POA: Diagnosis not present

## 2017-10-02 DIAGNOSIS — O24414 Gestational diabetes mellitus in pregnancy, insulin controlled: Secondary | ICD-10-CM

## 2017-10-02 DIAGNOSIS — O0992 Supervision of high risk pregnancy, unspecified, second trimester: Secondary | ICD-10-CM

## 2017-10-02 DIAGNOSIS — O09521 Supervision of elderly multigravida, first trimester: Secondary | ICD-10-CM

## 2017-10-02 DIAGNOSIS — Z3A39 39 weeks gestation of pregnancy: Secondary | ICD-10-CM

## 2017-10-02 NOTE — Patient Instructions (Signed)
Labor Induction Labor induction is when steps are taken to cause a pregnant woman to begin the labor process. Most women go into labor on their own between 37 weeks and 42 weeks of the pregnancy. When this does not happen or when there is a medical need, methods may be used to induce labor. Labor induction causes a pregnant woman's uterus to contract. It also causes the cervix to soften (ripen), open (dilate), and thin out (efface). Usually, labor is not induced before 39 weeks of the pregnancy unless there is a problem with the baby or mother. Before inducing labor, your health care provider will consider a number of factors, including the following:  The medical condition of you and the baby.  How many weeks along you are.  The status of the baby's lung maturity.  The condition of the cervix.  The position of the baby. What are the reasons for labor induction? Labor may be induced for the following reasons:  The health of the baby or mother is at risk.  The pregnancy is overdue by 1 week or more.  The water breaks but labor does not start on its own.  The mother has a health condition or serious illness, such as high blood pressure, infection, placental abruption, or diabetes.  The amniotic fluid amounts are low around the baby.  The baby is distressed. Convenience or wanting the baby to be born on a certain date is not a reason for inducing labor. What methods are used for labor induction? Several methods of labor induction may be used, such as:  Prostaglandin medicine. This medicine causes the cervix to dilate and ripen. The medicine will also start contractions. It can be taken by mouth or by inserting a suppository into the vagina.  Inserting a thin tube (catheter) with a balloon on the end into the vagina to dilate the cervix. Once inserted, the balloon is expanded with water, which causes the cervix to open.  Stripping the membranes. Your health care provider separates  amniotic sac tissue from the cervix, causing the cervix to be stretched and causing the release of a hormone called progesterone. This may cause the uterus to contract. It is often done during an office visit. You will be sent home to wait for the contractions to begin. You will then come in for an induction.  Breaking the water. Your health care provider makes a hole in the amniotic sac using a small instrument. Once the amniotic sac breaks, contractions should begin. This may still take hours to see an effect.  Medicine to trigger or strengthen contractions. This medicine is given through an IV access tube inserted into a vein in your arm. All of the methods of induction, besides stripping the membranes, will be done in the hospital. Induction is done in the hospital so that you and the baby can be carefully monitored. How long does it take for labor to be induced? Some inductions can take up to 2-3 days. Depending on the cervix, it usually takes less time. It takes longer when you are induced early in the pregnancy or if this is your first pregnancy. If a mother is still pregnant and the induction has been going on for 2-3 days, either the mother will be sent home or a cesarean delivery will be needed. What are the risks associated with labor induction? Some of the risks of induction include:  Changes in fetal heart rate, such as too high, too low, or erratic.  Fetal distress.    Chance of infection for the mother and baby.  Increased chance of having a cesarean delivery.  Breaking off (abruption) of the placenta from the uterus (rare).  Uterine rupture (very rare). When induction is needed for medical reasons, the benefits of induction may outweigh the risks. What are some reasons for not inducing labor? Labor induction should not be done if:  It is shown that your baby does not tolerate labor.  You have had previous surgeries on your uterus, such as a myomectomy or the removal of  fibroids.  Your placenta lies very low in the uterus and blocks the opening of the cervix (placenta previa).  Your baby is not in a head-down position.  The umbilical cord drops down into the birth canal in front of the baby. This could cut off the baby's blood and oxygen supply.  You have had a previous cesarean delivery.  There are unusual circumstances, such as the baby being extremely premature. This information is not intended to replace advice given to you by your health care provider. Make sure you discuss any questions you have with your health care provider. Document Released: 07/13/2006 Document Revised: 07/30/2015 Document Reviewed: 09/20/2012 Elsevier Interactive Patient Education  2017 [employer]  

## 2017-10-02 NOTE — Progress Notes (Signed)
History and Physical  Catherine Perez is a 37 y.o. G2P0010 [redacted]w[redacted]d  for Induction of Labor scheduled due to A2 DM .   See labor record for pregnancy highlights.  No recent pain, bleeding, ruptured membranes, or other signs of progressing labor.  PMHx: She  has a past medical history of Gestational diabetes and No known health problems. Also,  has a past surgical history that includes Dilation and curettage of uterus., family history is not on file.,  reports that she has been smoking cigarettes.  She has been smoking about 0.50 packs per day. She has never used smokeless tobacco. She reports that she does not drink alcohol or use drugs. She has a current medication list which includes the following prescription(s): accu-chek fastclix lancets, ferrous sulfate, glucose blood, insulin aspart, insulin glargine, and prenatal multivitamin. Also, is allergic to penicillins. OB History  Gravida Para Term Preterm AB Living  2       1    SAB TAB Ectopic Multiple Live Births  1            # Outcome Date GA Lbr Len/2nd Weight Sex Delivery Anes PTL Lv  2 Current           1 SAB           Patient denies any other pertinent gynecologic issues.   Review of Systems  Constitutional: Negative for chills, fever and malaise/fatigue.  HENT: Negative for congestion, sinus pain and sore throat.   Eyes: Negative for blurred vision and pain.  Respiratory: Negative for cough and wheezing.   Cardiovascular: Negative for chest pain and leg swelling.  Gastrointestinal: Negative for abdominal pain, constipation, diarrhea, heartburn, nausea and vomiting.  Genitourinary: Negative for dysuria, frequency, hematuria and urgency.  Musculoskeletal: Negative for back pain, joint pain, myalgias and neck pain.  Skin: Negative for itching and rash.  Neurological: Negative for dizziness, tremors and weakness.  Endo/Heme/Allergies: Does not bruise/bleed easily.  Psychiatric/Behavioral: Negative for depression. The patient is  not nervous/anxious and does not have insomnia.    Objective: BP 130/80   Wt 191 lb (86.6 kg)   LMP 12/28/2016 (Exact Date)   BMI 33.83 kg/m  Physical Exam  Vitals reviewed. Physical examination Constitutional NAD, Conversant  Skin No rashes, lesions or ulceration. Normal palpated skin turgor. No nodularity.  Lungs: Clear to auscultation.No rales or wheezes. Normal Respiratory effort, no retractions.  Heart: NSR.  No murmurs or rubs appreciated. No periferal edema  Abdomen: Gravid.  Non-tender.  No masses.  No HSM. No hernia  Extremities: Moves all appropriately.  Normal ROM for age. No lymphadenopathy.  Neuro: Grossly intact  Psych: Oriented to PPT.  Normal mood. Normal affect.     Pelvic:   Vulva: Normal appearance.  No lesions.  Vagina: No lesions or abnormalities noted.  Urethra No masses tenderness or scarring.  Meatus Normal size without lesions or prolapse.  Cervix: Cx 0/th/hi   Perineum: Normal exam.  No lesions.        Bimanual   Uterus: Enlarged.  Non-tender.    Adnexae: Not palpated.  Cul-de-sac: Negative for abnormality.      A NST procedure was performed with FHR monitoring and a normal baseline established, appropriate time of 20-40 minutes of evaluation, and accels >2 seen w 15x15 characteristics.  Results show a REACTIVE NST.   Review of ULTRASOUND.    I have personally reviewed images and report of recent ultrasound done at [facility].    Plan of management to  be discussed with patient.    AFI 10, Vtx  BS log- normal results; on Insulin  Assessment: Term Pregnancy for Induction of Labor due to A2 DM. 39+ weeks EGA.  Plan: Patient will undergo induction of labor with cervical ripening agents.     Patient has been fully informed of the pros and cons, risks and benefits of continued observation with fetal monitoring versus that of induction of labor.   She understands that there are uncommon risks to induction, which include but are not limited to :  frequent or prolonged uterine contractions, fetal distress, uterine rupture, and lack of successful induction.  These risks include all methods including Pitocin and Misoprostol and Cervadil.  Patient understands that using Misoprostol for labor induction is an "off label" indication although it has been studied extensively for this purpose and is an accepted method of induction.  She also has been informed of the increased risks for Cesarean with induction and should induction not be successful.  Patient consents to the induction plan of management.  Plans to bottle feed Plans IUD for contraception TDaP UTD  Annamarie MajorPaul Kiarrah Rausch, MD, Merlinda FrederickFACOG [facility], [facility] 10/02/2017  3:12 PM

## 2017-10-03 ENCOUNTER — Inpatient Hospital Stay: Payer: Managed Care, Other (non HMO) | Admitting: Anesthesiology

## 2017-10-03 ENCOUNTER — Other Ambulatory Visit: Payer: Self-pay

## 2017-10-03 ENCOUNTER — Inpatient Hospital Stay
Admission: EM | Admit: 2017-10-03 | Discharge: 2017-10-06 | DRG: 787 | Disposition: A | Discharge status: Home / Self Care | Payer: Managed Care, Other (non HMO) | Attending: Obstetrics and Gynecology | Admitting: Obstetrics and Gynecology

## 2017-10-03 DIAGNOSIS — O134 Gestational [pregnancy-induced] hypertension without significant proteinuria, complicating childbirth: Secondary | ICD-10-CM | POA: Diagnosis present

## 2017-10-03 DIAGNOSIS — O9081 Anemia of the puerperium: Secondary | ICD-10-CM | POA: Diagnosis not present

## 2017-10-03 DIAGNOSIS — O09521 Supervision of elderly multigravida, first trimester: Secondary | ICD-10-CM

## 2017-10-03 DIAGNOSIS — Z3A39 39 weeks gestation of pregnancy: Secondary | ICD-10-CM

## 2017-10-03 DIAGNOSIS — O0992 Supervision of high risk pregnancy, unspecified, second trimester: Secondary | ICD-10-CM

## 2017-10-03 DIAGNOSIS — O24424 Gestational diabetes mellitus in childbirth, insulin controlled: Secondary | ICD-10-CM | POA: Diagnosis present

## 2017-10-03 DIAGNOSIS — O24414 Gestational diabetes mellitus in pregnancy, insulin controlled: Secondary | ICD-10-CM

## 2017-10-03 DIAGNOSIS — O24919 Unspecified diabetes mellitus in pregnancy, unspecified trimester: Secondary | ICD-10-CM | POA: Diagnosis present

## 2017-10-03 DIAGNOSIS — D62 Acute posthemorrhagic anemia: Secondary | ICD-10-CM | POA: Diagnosis not present

## 2017-10-03 DIAGNOSIS — Z3A40 40 weeks gestation of pregnancy: Secondary | ICD-10-CM | POA: Diagnosis not present

## 2017-10-03 LAB — GLUCOSE, CAPILLARY
GLUCOSE-CAPILLARY: 69 mg/dL — AB (ref 70–99)
GLUCOSE-CAPILLARY: 97 mg/dL (ref 70–99)
GLUCOSE-CAPILLARY: 135 mg/dL — AB (ref 70–99)
GLUCOSE-CAPILLARY: 66 mg/dL — AB (ref 70–99)
GLUCOSE-CAPILLARY: 73 mg/dL (ref 70–99)
GLUCOSE-CAPILLARY: 69 mg/dL — AB (ref 70–99)
Glucose-Capillary: 96 mg/dL (ref 70–99)
Glucose-Capillary: 83 mg/dL (ref 70–99)
Glucose-Capillary: 77 mg/dL (ref 70–99)
Glucose-Capillary: 80 mg/dL (ref 70–99)
Glucose-Capillary: 102 mg/dL — ABNORMAL HIGH (ref 70–99)

## 2017-10-03 LAB — PROTEIN / CREATININE RATIO, URINE
Creatinine, Urine: 44 mg/dL
PROTEIN CREATININE RATIO: 0.2 mg/mg{creat} — AB (ref 0.00–0.15)
TOTAL PROTEIN, URINE: 9 mg/dL

## 2017-10-03 LAB — CBC
HEMATOCRIT: 38 % (ref 35.0–47.0)
HEMATOCRIT: 40.4 % (ref 35.0–47.0)
HEMOGLOBIN: 13.2 g/dL (ref 12.0–16.0)
Hemoglobin: 14 g/dL (ref 12.0–16.0)
MCH: 30.9 pg (ref 26.0–34.0)
MCH: 30.7 pg (ref 26.0–34.0)
MCHC: 34.9 g/dL (ref 32.0–36.0)
MCHC: 34.7 g/dL (ref 32.0–36.0)
MCV: 88.7 fL (ref 80.0–100.0)
MCV: 88.4 fL (ref 80.0–100.0)
PLATELETS: 319 10*3/uL (ref 150–440)
Platelets: 323 10*3/uL (ref 150–440)
RBC: 4.28 MIL/uL (ref 3.80–5.20)
RBC: 4.57 MIL/uL (ref 3.80–5.20)
RDW: 15 % — ABNORMAL HIGH (ref 11.5–14.5)
RDW: 15.1 % — AB (ref 11.5–14.5)
WBC: 14.7 10*3/uL — ABNORMAL HIGH (ref 3.6–11.0)
WBC: 19.6 10*3/uL — ABNORMAL HIGH (ref 3.6–11.0)

## 2017-10-03 LAB — COMPREHENSIVE METABOLIC PANEL
ALK PHOS: 94 U/L (ref 38–126)
ALT: 20 U/L (ref 0–44)
AST: 19 U/L (ref 15–41)
Albumin: 3.1 g/dL — ABNORMAL LOW (ref 3.5–5.0)
Anion gap: 8 (ref 5–15)
BUN: 11 mg/dL (ref 6–20)
CALCIUM: 9.3 mg/dL (ref 8.9–10.3)
CO2: 23 mmol/L (ref 22–32)
CREATININE: 0.45 mg/dL (ref 0.44–1.00)
Chloride: 106 mmol/L (ref 98–111)
GFR calc Af Amer: >60 mL/min (ref >60)
Glucose, Bld: 90 mg/dL (ref 70–99)
Potassium: 4 mmol/L (ref 3.5–5.1)
Sodium: 137 mmol/L (ref 135–145)
Total Bilirubin: 0.4 mg/dL (ref 0.3–1.2)
Total Protein: 6.3 g/dL — ABNORMAL LOW (ref 6.5–8.1)

## 2017-10-03 LAB — TYPE AND SCREEN
ABO/RH(D): A POS
Antibody Screen: NEGATIVE

## 2017-10-03 MED ORDER — AMMONIA AROMATIC IN INHA
RESPIRATORY_TRACT | Status: AC
  Filled 2017-10-03: qty 10

## 2017-10-03 MED ORDER — FAMOTIDINE IN NACL 20-0.9 MG/50ML-% IV SOLN
20.0000 mg | Freq: Two times a day (BID) | INTRAVENOUS | Status: DC
  Administered 2017-10-03: 20 mg via INTRAVENOUS
  Filled 2017-10-03 (×3): qty 50

## 2017-10-03 MED ORDER — ONDANSETRON HCL 4 MG/2ML IJ SOLN: 4.0000 mg | Freq: Four times a day (QID) | INTRAMUSCULAR | Status: DC | PRN

## 2017-10-03 MED ORDER — INSULIN ASPART 100 UNIT/ML ~~LOC~~ SOLN
0.0000 [IU] | Freq: Three times a day (TID) | SUBCUTANEOUS | Status: DC
  Administered 2017-10-03: 2 [IU] via SUBCUTANEOUS
  Filled 2017-10-03 (×6): qty 0.15
  Filled 2017-10-03: qty 1

## 2017-10-03 MED ORDER — OXYTOCIN 10 UNIT/ML IJ SOLN
INTRAMUSCULAR | Status: AC
  Filled 2017-10-03: qty 2

## 2017-10-03 MED ORDER — OXYTOCIN 40 UNITS IN LACTATED RINGERS INFUSION - SIMPLE MED
2.5000 [IU]/h | INTRAVENOUS | Status: DC
  Administered 2017-10-04: 2.5 [IU]/h via INTRAVENOUS
  Filled 2017-10-03 (×2): qty 1000

## 2017-10-03 MED ORDER — MISOPROSTOL 200 MCG PO TABS
ORAL_TABLET | ORAL | Status: AC
  Filled 2017-10-03: qty 4

## 2017-10-03 MED ORDER — OXYTOCIN 40 UNITS IN LACTATED RINGERS INFUSION - SIMPLE MED
1.0000 m[IU]/min | INTRAVENOUS | Status: DC
  Administered 2017-10-03: 2 m[IU]/min via INTRAVENOUS

## 2017-10-03 MED ORDER — BUTORPHANOL TARTRATE 2 MG/ML IJ SOLN
1.0000 mg | INTRAMUSCULAR | Status: DC | PRN
  Administered 2017-10-03 (×2): 1 mg via INTRAVENOUS
  Filled 2017-10-03: qty 1

## 2017-10-03 MED ORDER — TERBUTALINE SULFATE 1 MG/ML IJ SOLN: 0.2500 mg | Freq: Once | INTRAMUSCULAR | Status: DC | PRN

## 2017-10-03 MED ORDER — LACTATED RINGERS IV SOLN
INTRAVENOUS | Status: DC
  Administered 2017-10-03 (×3): via INTRAVENOUS

## 2017-10-03 MED ORDER — LIDOCAINE HCL (PF) 1 % IJ SOLN
INTRAMUSCULAR | Status: AC
  Filled 2017-10-03: qty 30

## 2017-10-03 MED ORDER — OXYTOCIN BOLUS FROM INFUSION: 500.0000 mL | Freq: Once | INTRAVENOUS | Status: DC

## 2017-10-03 MED ORDER — MISOPROSTOL 25 MCG QUARTER TABLET
25.0000 ug | ORAL_TABLET | ORAL | Status: DC | PRN
  Administered 2017-10-03 (×3): 25 ug via VAGINAL
  Filled 2017-10-03 (×4): qty 1

## 2017-10-03 MED ORDER — ACETAMINOPHEN 325 MG PO TABS: 650.0000 mg | ORAL_TABLET | ORAL | Status: DC | PRN

## 2017-10-03 MED ORDER — FENTANYL 2.5 MCG/ML W/ROPIVACAINE 0.15% IN NS 100 ML EPIDURAL (ARMC)
EPIDURAL | Status: AC
  Filled 2017-10-03: qty 100

## 2017-10-03 MED ORDER — CALCIUM CARBONATE ANTACID 500 MG PO CHEW: 1.0000 | CHEWABLE_TABLET | Freq: Two times a day (BID) | ORAL | Status: DC

## 2017-10-03 MED ORDER — LACTATED RINGERS IV SOLN: 500.0000 mL | INTRAVENOUS | Status: DC | PRN

## 2017-10-03 NOTE — Progress Notes (Signed)
Patient arrived to LDR 3 for scheduled IOL. Reports good fetal movement. Denies leaking of fluid, vaginal bleeding, or contractions. Discussed plan of care. Patient verbalized understanding.

## 2017-10-03 NOTE — Progress Notes (Signed)
Subjective:  No concerns  Objective:   Vitals: Blood pressure 126/85, pulse 84, temperature 98.3 F (36.8 C), temperature source Oral, resp. rate 18, height 5\' 3"  (1.6 m), weight 191 lb (86.6 kg), last menstrual period 12/28/2016. General: NAD Abdomen: soft, non-tender Cervical Exam:  Dilation: Fingertip Effacement (%): 60 Station: -3 Exam by:: Laural BenesB. Nielsen RN  FHT: 135, moderate, +accels, no decels Toco: q475min  Results for orders placed or performed during the hospital encounter of 10/03/17 (from the past 24 hour(s))  CBC     Status: Abnormal   Collection Time: 10/03/17  5:00 AM  Result Value Ref Range   WBC 14.7 (H) 3.6 - 11.0 K/uL   RBC 4.28 3.80 - 5.20 MIL/uL   Hemoglobin 13.2 12.0 - 16.0 g/dL   HCT 16.138.0 09.635.0 - 04.547.0 %   MCV 88.7 80.0 - 100.0 fL   MCH 30.9 26.0 - 34.0 pg   MCHC 34.9 32.0 - 36.0 g/dL   RDW 40.915.0 (H) 81.111.5 - 91.414.5 %   Platelets 323 150 - 440 K/uL  Glucose, capillary     Status: None   Collection Time: 10/03/17  6:03 AM  Result Value Ref Range   Glucose-Capillary 96 70 - 99 mg/dL  Comprehensive metabolic panel     Status: Abnormal   Collection Time: 10/03/17  6:52 AM  Result Value Ref Range   Sodium 137 135 - 145 mmol/L   Potassium 4.0 3.5 - 5.1 mmol/L   Chloride 106 98 - 111 mmol/L   CO2 23 22 - 32 mmol/L   Glucose, Bld 90 70 - 99 mg/dL   BUN 11 6 - 20 mg/dL   Creatinine, Ser 7.820.45 0.44 - 1.00 mg/dL   Calcium 9.3 8.9 - 95.610.3 mg/dL   Total Protein 6.3 (L) 6.5 - 8.1 g/dL   Albumin 3.1 (L) 3.5 - 5.0 g/dL   AST 19 15 - 41 U/L   ALT 20 0 - 44 U/L   Alkaline Phosphatase 94 38 - 126 U/L   Total Bilirubin 0.4 0.3 - 1.2 mg/dL   GFR calc non Af Amer >60 >60 mL/min   GFR calc Af Amer >60 >60 mL/min   Anion gap 8 5 - 15  Type and screen     Status: None   Collection Time: 10/03/17  6:52 AM  Result Value Ref Range   ABO/RH(D) A POS    Antibody Screen NEG    Sample Expiration      10/06/2017 Performed at [facility], [address]  [place], [place] [address]   Glucose, capillary     Status: None   Collection Time: 10/03/17  7:58 AM  Result Value Ref Range   Glucose-Capillary 83 70 - 99 mg/dL   Comment 1 Notify RN   Protein / creatinine ratio, urine     Status: Abnormal   Collection Time: 10/03/17  8:00 AM  Result Value Ref Range   Creatinine, Urine 44 mg/dL   Total Protein, Urine 9 mg/dL   Protein Creatinine Ratio 0.20 (H) 0.00 - 0.15 mg/mg[Cre]  Glucose, capillary     Status: None   Collection Time: 10/03/17 10:06 AM  Result Value Ref Range   Glucose-Capillary 77 70 - 99 mg/dL   Comment 1 Notify RN     Assessment:   37 y.o. G2P0010 10728w6d IOL insulin dependent GDM  Plan:   1) Labor -continue cytotec, intracervical foley catheter placed  2) Fetus - cat I  3) GDM - continue sliding  scal  4) GHTN - mild range BP did have a severe range but taken during foley catheter placement.  Normal labs.  Should pressures become severe range would start magnesium sulfate  Vena Austria, MD, [facility], [facility] 10/03/2017, 11:10 AM

## 2017-10-03 NOTE — Progress Notes (Signed)
Subjective:  Doing well, comfortable  Objective:   Vitals: Blood pressure 133/85, pulse 95, temperature 98.6 F (37 C), temperature source Oral, resp. rate 18, height 5\' 3"  (1.6 m), weight 191 lb (86.6 kg), last menstrual period 12/28/2016. General: NAD Abdomen: Gravid, non-tender Cervical Exam:  Dilation: 4 Effacement (%): 70 Cervical Position: Posterior Station: -2 Presentation: Vertex Exam by:: Dr. Bonney AidStaebler  FHT: 140, moderate, +accels, no decels Toco: irregular  Results for orders placed or performed during the hospital encounter of 10/03/17 (from the past 24 hour(s))  CBC     Status: Abnormal   Collection Time: 10/03/17  5:00 AM  Result Value Ref Range   WBC 14.7 (H) 3.6 - 11.0 K/uL   RBC 4.28 3.80 - 5.20 MIL/uL   Hemoglobin 13.2 12.0 - 16.0 g/dL   HCT 16.138.0 09.635.0 - 04.547.0 %   MCV 88.7 80.0 - 100.0 fL   MCH 30.9 26.0 - 34.0 pg   MCHC 34.9 32.0 - 36.0 g/dL   RDW 40.915.0 (H) 81.111.5 - 91.414.5 %   Platelets 323 150 - 440 K/uL  Glucose, capillary     Status: None   Collection Time: 10/03/17  6:03 AM  Result Value Ref Range   Glucose-Capillary 96 70 - 99 mg/dL  Comprehensive metabolic panel     Status: Abnormal   Collection Time: 10/03/17  6:52 AM  Result Value Ref Range   Sodium 137 135 - 145 mmol/L   Potassium 4.0 3.5 - 5.1 mmol/L   Chloride 106 98 - 111 mmol/L   CO2 23 22 - 32 mmol/L   Glucose, Bld 90 70 - 99 mg/dL   BUN 11 6 - 20 mg/dL   Creatinine, Ser 7.820.45 0.44 - 1.00 mg/dL   Calcium 9.3 8.9 - 95.610.3 mg/dL   Total Protein 6.3 (L) 6.5 - 8.1 g/dL   Albumin 3.1 (L) 3.5 - 5.0 g/dL   AST 19 15 - 41 U/L   ALT 20 0 - 44 U/L   Alkaline Phosphatase 94 38 - 126 U/L   Total Bilirubin 0.4 0.3 - 1.2 mg/dL   GFR calc non Af Amer >60 >60 mL/min   GFR calc Af Amer >60 >60 mL/min   Anion gap 8 5 - 15  Type and screen     Status: None   Collection Time: 10/03/17  6:52 AM  Result Value Ref Range   ABO/RH(D) A POS    Antibody Screen NEG    Sample Expiration       10/06/2017 Performed at [facility], [address] [place], [place] [address]   Glucose, capillary     Status: None   Collection Time: 10/03/17  7:58 AM  Result Value Ref Range   Glucose-Capillary 83 70 - 99 mg/dL   Comment 1 Notify RN   Protein / creatinine ratio, urine     Status: Abnormal   Collection Time: 10/03/17  8:00 AM  Result Value Ref Range   Creatinine, Urine 44 mg/dL   Total Protein, Urine 9 mg/dL   Protein Creatinine Ratio 0.20 (H) 0.00 - 0.15 mg/mg[Cre]  Glucose, capillary     Status: None   Collection Time: 10/03/17 10:06 AM  Result Value Ref Range   Glucose-Capillary 77 70 - 99 mg/dL   Comment 1 Notify RN   Glucose, capillary     Status: Abnormal   Collection Time: 10/03/17 12:02 PM  Result Value Ref Range   Glucose-Capillary 69 (L) 70 - 99 mg/dL   Comment  1 Notify RN   Glucose, capillary     Status: None   Collection Time: 10/03/17 12:28 PM  Result Value Ref Range   Glucose-Capillary 97 70 - 99 mg/dL   Comment 1 Notify RN   Glucose, capillary     Status: None   Collection Time: 10/03/17  2:05 PM  Result Value Ref Range   Glucose-Capillary 80 70 - 99 mg/dL   Comment 1 Notify RN   Glucose, capillary     Status: Abnormal   Collection Time: 10/03/17  4:01 PM  Result Value Ref Range   Glucose-Capillary 102 (H) 70 - 99 mg/dL   Comment 1 Notify RN   Glucose, capillary     Status: Abnormal   Collection Time: 10/03/17  6:05 PM  Result Value Ref Range   Glucose-Capillary 135 (H) 70 - 99 mg/dL    Assessment:   37 y.o. G2P0010 [redacted]w[redacted]d IOL GDM  Plan:   1) Labor - foley bulb out, switch to pitocin  2) Fetus - cat I tracing - 6lbs 13oz on 7/22  3) GHTN - mild range BP's, normal labs (1 severe range at time of foley bulb placement)  4) GDM - SSI    Vena Austria, MD, [facility], [facility] 10/03/2017, 6:58 PM

## 2017-10-03 NOTE — Anesthesia Preprocedure Evaluation (Signed)
Anesthesia Evaluation  Patient identified by MRN, date of birth, ID band Patient awake    Reviewed: Allergy & Precautions, NPO status , Patient's Chart, lab work & pertinent test results  Airway Mallampati: II  TM Distance: >3 FB     Dental  (+) Teeth Intact   Pulmonary Current Smoker,    Pulmonary exam normal        Cardiovascular negative cardio ROS Normal cardiovascular exam     Neuro/Psych negative neurological ROS  negative psych ROS   GI/Hepatic negative GI ROS, Neg liver ROS,   Endo/Other  diabetes  Renal/GU negative Renal ROS  negative genitourinary   Musculoskeletal negative musculoskeletal ROS (+)   Abdominal Normal abdominal exam  (+)   Peds negative pediatric ROS (+)  Hematology  (+) anemia ,   Anesthesia Other Findings   Reproductive/Obstetrics                             Anesthesia Physical Anesthesia Plan  ASA: II  Anesthesia Plan: Epidural   Post-op Pain Management:    Induction:   PONV Risk Score and Plan:   Airway Management Planned: Natural Airway  Additional Equipment:   Intra-op Plan:   Post-operative Plan:   Informed Consent: I have reviewed the patients History and Physical, chart, labs and discussed the procedure including the risks, benefits and alternatives for the proposed anesthesia with the patient or authorized representative who has indicated his/her understanding and acceptance.   Dental advisory given  Plan Discussed with: CRNA and Surgeon  Anesthesia Plan Comments:         Anesthesia Quick Evaluation

## 2017-10-03 NOTE — H&P (Signed)
History and Physical Interval Note:  10/03/2017 7:01 AM  Catherine Perez  has presented today for INDUCTION OF LABOR with the diagnosis of gestational diabetes, insulin controlled. The patient has consented to labor induction. The patient's history has been reviewed, patient examined, and she is stable for induction as planned.  See H&P entered on 10/02/2017 by Dr. Tiburcio PeaHarris. I have reviewed the patient's chart and labs.  Questions were answered to the patient's satisfaction.    Mildly elevated blood pressures on admission, CMP sent along with labs.  Cervix examined by RN: 0.5/50-3 and Cytotec placed.  Catherine Perez, CNM

## 2017-10-04 ENCOUNTER — Encounter: Payer: Self-pay | Admitting: Obstetrics and Gynecology

## 2017-10-04 ENCOUNTER — Encounter
Admission: EM | Disposition: A | Discharge status: Home / Self Care | Payer: Self-pay | Source: Home / Self Care | Attending: Obstetrics and Gynecology

## 2017-10-04 DIAGNOSIS — O24414 Gestational diabetes mellitus in pregnancy, insulin controlled: Secondary | ICD-10-CM

## 2017-10-04 DIAGNOSIS — O134 Gestational [pregnancy-induced] hypertension without significant proteinuria, complicating childbirth: Secondary | ICD-10-CM

## 2017-10-04 DIAGNOSIS — O139 Gestational [pregnancy-induced] hypertension without significant proteinuria, unspecified trimester: Secondary | ICD-10-CM

## 2017-10-04 DIAGNOSIS — Z3A40 40 weeks gestation of pregnancy: Secondary | ICD-10-CM

## 2017-10-04 DIAGNOSIS — O24424 Gestational diabetes mellitus in childbirth, insulin controlled: Secondary | ICD-10-CM

## 2017-10-04 LAB — GLUCOSE, CAPILLARY
GLUCOSE-CAPILLARY: 88 mg/dL (ref 70–99)
GLUCOSE-CAPILLARY: 92 mg/dL (ref 70–99)
GLUCOSE-CAPILLARY: 96 mg/dL (ref 70–99)

## 2017-10-04 LAB — RPR: RPR Ser Ql: NONREACTIVE

## 2017-10-04 SURGERY — Surgical Case
Anesthesia: Epidural

## 2017-10-04 MED ORDER — PHENYLEPHRINE 40 MCG/ML (10ML) SYRINGE FOR IV PUSH (FOR BLOOD PRESSURE SUPPORT): 80.0000 ug | PREFILLED_SYRINGE | INTRAVENOUS | Status: DC | PRN

## 2017-10-04 MED ORDER — SIMETHICONE 80 MG PO CHEW
80.0000 mg | CHEWABLE_TABLET | ORAL | Status: DC | PRN
  Administered 2017-10-05: 80 mg via ORAL
  Filled 2017-10-04: qty 1

## 2017-10-04 MED ORDER — ONDANSETRON HCL 4 MG/2ML IJ SOLN
INTRAMUSCULAR | Status: DC | PRN
  Administered 2017-10-04: 4 mg via INTRAVENOUS

## 2017-10-04 MED ORDER — OXYCODONE-ACETAMINOPHEN 5-325 MG PO TABS
1.0000 | ORAL_TABLET | ORAL | Status: DC | PRN
  Administered 2017-10-04 – 2017-10-06 (×6): 1 via ORAL
  Filled 2017-10-04 (×5): qty 1

## 2017-10-04 MED ORDER — DIBUCAINE 1 % RE OINT: 1.0000 "application " | TOPICAL_OINTMENT | RECTAL | Status: DC | PRN

## 2017-10-04 MED ORDER — SOD CITRATE-CITRIC ACID 500-334 MG/5ML PO SOLN: 30.0000 mL | ORAL | Status: DC

## 2017-10-04 MED ORDER — OXYCODONE-ACETAMINOPHEN 5-325 MG PO TABS
2.0000 | ORAL_TABLET | ORAL | Status: DC | PRN
Start: 2017-10-04 — End: 2017-10-06
  Filled 2017-10-04: qty 2

## 2017-10-04 MED ORDER — SIMETHICONE 80 MG PO CHEW
80.0000 mg | CHEWABLE_TABLET | ORAL | Status: DC
  Filled 2017-10-04: qty 1

## 2017-10-04 MED ORDER — FENTANYL 2.5 MCG/ML W/ROPIVACAINE 0.15% IN NS 100 ML EPIDURAL (ARMC): 12.0000 mL/h | EPIDURAL | Status: DC

## 2017-10-04 MED ORDER — MORPHINE SULFATE (PF) 0.5 MG/ML IJ SOLN
INTRAMUSCULAR | Status: DC | PRN
  Administered 2017-10-04: 3 mg via EPIDURAL

## 2017-10-04 MED ORDER — CEFAZOLIN SODIUM-DEXTROSE 2-4 GM/100ML-% IV SOLN
2.0000 g | INTRAVENOUS | Status: AC
  Administered 2017-10-04: 2 g via INTRAVENOUS
  Filled 2017-10-04: qty 100

## 2017-10-04 MED ORDER — ACETAMINOPHEN 325 MG PO TABS: 650.0000 mg | ORAL_TABLET | ORAL | Status: DC | PRN

## 2017-10-04 MED ORDER — MENTHOL 3 MG MT LOZG
1.0000 | LOZENGE | OROMUCOSAL | Status: DC | PRN
  Filled 2017-10-04: qty 9

## 2017-10-04 MED ORDER — SENNOSIDES-DOCUSATE SODIUM 8.6-50 MG PO TABS
2.0000 | ORAL_TABLET | ORAL | Status: DC
  Administered 2017-10-05 – 2017-10-06 (×2): 2 via ORAL
  Filled 2017-10-04 (×2): qty 2

## 2017-10-04 MED ORDER — MORPHINE SULFATE (PF) 2 MG/ML IV SOLN: 1.0000 mg | INTRAVENOUS | Status: AC | PRN

## 2017-10-04 MED ORDER — FENTANYL 2.5 MCG/ML W/ROPIVACAINE 0.15% IN NS 100 ML EPIDURAL (ARMC)
EPIDURAL | Status: DC | PRN
  Administered 2017-10-04: 12 mL/h via EPIDURAL

## 2017-10-04 MED ORDER — SIMETHICONE 80 MG PO CHEW
80.0000 mg | CHEWABLE_TABLET | Freq: Three times a day (TID) | ORAL | Status: DC
  Administered 2017-10-04 – 2017-10-06 (×7): 80 mg via ORAL
  Filled 2017-10-04 (×6): qty 1

## 2017-10-04 MED ORDER — PHENYLEPHRINE HCL 10 MG/ML IJ SOLN
INTRAMUSCULAR | Status: DC | PRN
  Administered 2017-10-04 (×2): 100 ug via INTRAVENOUS

## 2017-10-04 MED ORDER — FENTANYL CITRATE (PF) 100 MCG/2ML IJ SOLN: 25.0000 ug | INTRAMUSCULAR | Status: DC | PRN

## 2017-10-04 MED ORDER — OXYTOCIN 40 UNITS IN LACTATED RINGERS INFUSION - SIMPLE MED
2.5000 [IU]/h | INTRAVENOUS | Status: AC
  Filled 2017-10-04: qty 1000

## 2017-10-04 MED ORDER — WITCH HAZEL-GLYCERIN EX PADS: 1.0000 "application " | MEDICATED_PAD | CUTANEOUS | Status: DC | PRN

## 2017-10-04 MED ORDER — BUPIVACAINE 0.25 % ON-Q PUMP DUAL CATH 400 ML
400.0000 mL | INJECTION | Status: DC
  Filled 2017-10-04: qty 400

## 2017-10-04 MED ORDER — MORPHINE SULFATE (PF) 0.5 MG/ML IJ SOLN
INTRAMUSCULAR | Status: AC
  Filled 2017-10-04: qty 10

## 2017-10-04 MED ORDER — LIDOCAINE HCL (PF) 2 % IJ SOLN
INTRAMUSCULAR | Status: DC | PRN
  Administered 2017-10-04: 4 mL via INTRADERMAL

## 2017-10-04 MED ORDER — SOD CITRATE-CITRIC ACID 500-334 MG/5ML PO SOLN
ORAL | Status: AC
Start: 2017-10-04 — End: 2017-10-04
  Administered 2017-10-04: 30 mL
  Filled 2017-10-04: qty 15

## 2017-10-04 MED ORDER — BUPIVACAINE HCL (PF) 0.25 % IJ SOLN
INTRAMUSCULAR | Status: DC | PRN
  Administered 2017-10-04: 10 mL via EPIDURAL

## 2017-10-04 MED ORDER — BUPIVACAINE ON-Q PAIN PUMP (FOR ORDER SET NO CHG)
INJECTION | Status: DC
  Filled 2017-10-04: qty 1

## 2017-10-04 MED ORDER — DIPHENHYDRAMINE HCL 25 MG PO CAPS: 25.0000 mg | ORAL_CAPSULE | Freq: Four times a day (QID) | ORAL | Status: DC | PRN

## 2017-10-04 MED ORDER — AZITHROMYCIN 500 MG IV SOLR
500.0000 mg | Freq: Once | INTRAVENOUS | Status: AC
  Administered 2017-10-04: 500 mg via INTRAVENOUS
  Filled 2017-10-04: qty 500

## 2017-10-04 MED ORDER — LACTATED RINGERS IV SOLN: INTRAVENOUS | Status: DC

## 2017-10-04 MED ORDER — LIDOCAINE-EPINEPHRINE (PF) 1.5 %-1:200000 IJ SOLN
INTRAMUSCULAR | Status: DC | PRN
  Administered 2017-10-04: 3 mL via PERINEURAL

## 2017-10-04 MED ORDER — EPHEDRINE 5 MG/ML INJ: 10.0000 mg | INTRAVENOUS | Status: DC | PRN

## 2017-10-04 MED ORDER — IBUPROFEN 600 MG PO TABS
600.0000 mg | ORAL_TABLET | Freq: Four times a day (QID) | ORAL | Status: DC
  Administered 2017-10-04 – 2017-10-06 (×8): 600 mg via ORAL
  Filled 2017-10-04 (×10): qty 1

## 2017-10-04 MED ORDER — BUPIVACAINE HCL (PF) 0.5 % IJ SOLN
INTRAMUSCULAR | Status: DC | PRN
  Administered 2017-10-04: 10 mL

## 2017-10-04 MED ORDER — ONDANSETRON HCL 4 MG/2ML IJ SOLN: 4.0000 mg | Freq: Once | INTRAMUSCULAR | Status: DC | PRN

## 2017-10-04 MED ORDER — OXYTOCIN 40 UNITS IN LACTATED RINGERS INFUSION - SIMPLE MED
INTRAVENOUS | Status: DC | PRN
  Administered 2017-10-04: 1 mL via INTRAVENOUS

## 2017-10-04 MED ORDER — LIDOCAINE HCL (PF) 1 % IJ SOLN
INTRAMUSCULAR | Status: DC | PRN
  Administered 2017-10-03: 3 mL

## 2017-10-04 MED ORDER — LACTATED RINGERS IV SOLN: 500.0000 mL | Freq: Once | INTRAVENOUS | Status: DC

## 2017-10-04 MED ORDER — DIPHENHYDRAMINE HCL 50 MG/ML IJ SOLN: 12.5000 mg | INTRAMUSCULAR | Status: DC | PRN

## 2017-10-04 MED ORDER — PROPOFOL 10 MG/ML IV BOLUS
INTRAVENOUS | Status: AC
  Filled 2017-10-04: qty 20

## 2017-10-04 MED ORDER — BUPIVACAINE HCL 0.5 % IJ SOLN
20.0000 mL | INTRAMUSCULAR | Status: DC
  Filled 2017-10-04: qty 20

## 2017-10-04 MED ORDER — PRENATAL MULTIVITAMIN CH
1.0000 | ORAL_TABLET | Freq: Every day | ORAL | Status: DC
  Administered 2017-10-04 – 2017-10-06 (×3): 1 via ORAL
  Filled 2017-10-04 (×3): qty 1

## 2017-10-04 MED ORDER — COCONUT OIL OIL: 1.0000 "application " | TOPICAL_OIL | Status: DC | PRN

## 2017-10-04 SURGICAL SUPPLY — 28 items
BAG COUNTER SPONGE EZ (MISCELLANEOUS) ×4 IMPLANT
CANISTER SUCT 3000ML PPV (MISCELLANEOUS) ×3 IMPLANT
CATH KIT ON-Q SILVERSOAK 5IN (CATHETERS) ×6 IMPLANT
CHLORAPREP W/TINT 26ML (MISCELLANEOUS) ×6 IMPLANT
CLOSURE WOUND 1/2 X4 (GAUZE/BANDAGES/DRESSINGS) ×1
COUNTER SPONGE BAG EZ (MISCELLANEOUS) ×2
DERMABOND ADVANCED (GAUZE/BANDAGES/DRESSINGS) ×4
DERMABOND ADVANCED .7 DNX12 (GAUZE/BANDAGES/DRESSINGS) ×2 IMPLANT
DRSG OPSITE POSTOP 4X10 (GAUZE/BANDAGES/DRESSINGS) ×3 IMPLANT
DRSG TELFA 3X8 NADH (GAUZE/BANDAGES/DRESSINGS) IMPLANT
ELECT CAUTERY BLADE 6.4 (BLADE) ×3 IMPLANT
ELECT REM PT RETURN 9FT ADLT (ELECTROSURGICAL) ×3
ELECTRODE REM PT RTRN 9FT ADLT (ELECTROSURGICAL) ×1 IMPLANT
GAUZE SPONGE 4X4 12PLY STRL (GAUZE/BANDAGES/DRESSINGS) IMPLANT
GLOVE BIO SURGEON STRL SZ7 (GLOVE) ×9 IMPLANT
GLOVE INDICATOR 7.5 STRL GRN (GLOVE) ×6 IMPLANT
GOWN STRL REUS W/ TWL LRG LVL3 (GOWN DISPOSABLE) ×3 IMPLANT
GOWN STRL REUS W/TWL LRG LVL3 (GOWN DISPOSABLE) ×6
NS IRRIG 1000ML POUR BTL (IV SOLUTION) ×3 IMPLANT
PACK C SECTION AR (MISCELLANEOUS) ×3 IMPLANT
PAD OB MATERNITY 4.3X12.25 (PERSONAL CARE ITEMS) ×3 IMPLANT
PAD PREP 24X41 OB/GYN DISP (PERSONAL CARE ITEMS) ×3 IMPLANT
STRIP CLOSURE SKIN 1/2X4 (GAUZE/BANDAGES/DRESSINGS) ×2 IMPLANT
SUT MNCRL AB 4-0 PS2 18 (SUTURE) IMPLANT
SUT PDS AB 1 TP1 96 (SUTURE) ×3 IMPLANT
SUT VIC AB 0 CTX 36 (SUTURE) ×4
SUT VIC AB 0 CTX36XBRD ANBCTRL (SUTURE) ×2 IMPLANT
SUT VIC AB 2-0 CT1 36 (SUTURE) IMPLANT

## 2017-10-04 NOTE — Transfer of Care (Signed)
Immediate Anesthesia Transfer of Care Note  Patient: Catherine Perez L Zeidan  Procedure(s) Performed: CESAREAN SECTION (N/A )  Patient Location: PACU  Anesthesia Type:Epidural  Level of Consciousness: awake, alert  and patient cooperative  Airway & Oxygen Therapy: Patient Spontanous Breathing  Post-op Assessment: Report given to RN and Post -op Vital signs reviewed and stable  Post vital signs: Reviewed and stable  Last Vitals:  Vitals Value Taken Time  BP    Temp    Pulse    Resp    SpO2      Last Pain:  Vitals:   10/04/17 0128  TempSrc: Oral  PainSc:       Patients Stated Pain Goal: 0 (10/03/17 2219)  Complications: No apparent anesthesia complications

## 2017-10-04 NOTE — Progress Notes (Signed)
Subjective:  Comfortable, post epidural.  Called to evaluate tracing  Objective:   Vitals: Blood pressure (!) 109/48, pulse 99, temperature 97.7 F (36.5 C), temperature source Oral, resp. rate 18, height 5\' 3"  (1.6 m), weight 191 lb (86.6 kg), last menstrual period 12/28/2016. General: NAD Abdomen: gravid, non-tender Cervical Exam:  Dilation: 4.5 Effacement (%): 90 Cervical Position: Posterior Station: -1 Presentation: Vertex Exam by:: A  Heather Mckendree  FHT: 150, moderate, +accels with scalp stim, late decelerations Toco: q2-17min  Results for orders placed or performed during the hospital encounter of 10/03/17 (from the past 24 hour(s))  CBC     Status: Abnormal   Collection Time: 10/03/17  5:00 AM  Result Value Ref Range   WBC 14.7 (H) 3.6 - 11.0 K/uL   RBC 4.28 3.80 - 5.20 MIL/uL   Hemoglobin 13.2 12.0 - 16.0 g/dL   HCT 96.0 45.4 - 09.8 %   MCV 88.7 80.0 - 100.0 fL   MCH 30.9 26.0 - 34.0 pg   MCHC 34.9 32.0 - 36.0 g/dL   RDW 11.9 (H) 14.7 - 82.9 %   Platelets 323 150 - 440 K/uL  Glucose, capillary     Status: None   Collection Time: 10/03/17  6:03 AM  Result Value Ref Range   Glucose-Capillary 96 70 - 99 mg/dL  Comprehensive metabolic panel     Status: Abnormal   Collection Time: 10/03/17  6:52 AM  Result Value Ref Range   Sodium 137 135 - 145 mmol/L   Potassium 4.0 3.5 - 5.1 mmol/L   Chloride 106 98 - 111 mmol/L   CO2 23 22 - 32 mmol/L   Glucose, Bld 90 70 - 99 mg/dL   BUN 11 6 - 20 mg/dL   Creatinine, Ser 5.62 0.44 - 1.00 mg/dL   Calcium 9.3 8.9 - 13.0 mg/dL   Total Protein 6.3 (L) 6.5 - 8.1 g/dL   Albumin 3.1 (L) 3.5 - 5.0 g/dL   AST 19 15 - 41 U/L   ALT 20 0 - 44 U/L   Alkaline Phosphatase 94 38 - 126 U/L   Total Bilirubin 0.4 0.3 - 1.2 mg/dL   GFR calc non Af Amer >60 >60 mL/min   GFR calc Af Amer >60 >60 mL/min   Anion gap 8 5 - 15  Type and screen     Status: None   Collection Time: 10/03/17  6:52 AM  Result Value Ref Range   ABO/RH(D) A POS    Antibody Screen NEG    Sample Expiration      10/06/2017 Performed at [facility], [address] [place], [place] [address]   Glucose, capillary     Status: None   Collection Time: 10/03/17  7:58 AM  Result Value Ref Range   Glucose-Capillary 83 70 - 99 mg/dL   Comment 1 Notify RN   Protein / creatinine ratio, urine     Status: Abnormal   Collection Time: 10/03/17  8:00 AM  Result Value Ref Range   Creatinine, Urine 44 mg/dL   Total Protein, Urine 9 mg/dL   Protein Creatinine Ratio 0.20 (H) 0.00 - 0.15 mg/mg[Cre]  Glucose, capillary     Status: None   Collection Time: 10/03/17 10:06 AM  Result Value Ref Range   Glucose-Capillary 77 70 - 99 mg/dL   Comment 1 Notify RN   Glucose, capillary     Status: Abnormal   Collection Time: 10/03/17 12:02 PM  Result Value Ref Range   Glucose-Capillary  69 (L) 70 - 99 mg/dL   Comment 1 Notify RN   Glucose, capillary     Status: None   Collection Time: 10/03/17 12:28 PM  Result Value Ref Range   Glucose-Capillary 97 70 - 99 mg/dL   Comment 1 Notify RN   Glucose, capillary     Status: None   Collection Time: 10/03/17  2:05 PM  Result Value Ref Range   Glucose-Capillary 80 70 - 99 mg/dL   Comment 1 Notify RN   Glucose, capillary     Status: Abnormal   Collection Time: 10/03/17  4:01 PM  Result Value Ref Range   Glucose-Capillary 102 (H) 70 - 99 mg/dL   Comment 1 Notify RN   Glucose, capillary     Status: Abnormal   Collection Time: 10/03/17  6:05 PM  Result Value Ref Range   Glucose-Capillary 135 (H) 70 - 99 mg/dL  Glucose, capillary     Status: Abnormal   Collection Time: 10/03/17  7:59 PM  Result Value Ref Range   Glucose-Capillary 66 (L) 70 - 99 mg/dL  Glucose, capillary     Status: None   Collection Time: 10/03/17  8:41 PM  Result Value Ref Range   Glucose-Capillary 73 70 - 99 mg/dL  Glucose, capillary     Status: Abnormal   Collection Time: 10/03/17 10:25 PM  Result Value Ref Range   Glucose-Capillary 69 (L) 70  - 99 mg/dL  CBC     Status: Abnormal   Collection Time: 10/03/17 10:47 PM  Result Value Ref Range   WBC 19.6 (H) 3.6 - 11.0 K/uL   RBC 4.57 3.80 - 5.20 MIL/uL   Hemoglobin 14.0 12.0 - 16.0 g/dL   HCT 16.140.4 09.635.0 - 04.547.0 %   MCV 88.4 80.0 - 100.0 fL   MCH 30.7 26.0 - 34.0 pg   MCHC 34.7 32.0 - 36.0 g/dL   RDW 40.915.1 (H) 81.111.5 - 91.414.5 %   Platelets 319 150 - 440 K/uL  Glucose, capillary     Status: None   Collection Time: 10/04/17 12:28 AM  Result Value Ref Range   Glucose-Capillary 92 70 - 99 mg/dL    Assessment:   37 y.o. G2P0010 2269w0d IOL insulin dependent GDM, GHTN  Plan:   1) Labor - pitocin stopped currently.  May restart pitocin if 30 minutes of category I tracing observed, titrate using IUPC  2) Fetus  - category II tracing post epidural, previously category I.  Normotensive but relative hypotension from previous BP's.  Observed over the past 30 minutes and starting to resolve - O2 applied - position changes prn  3) GDM - SSI  Vena AustriaAndreas Alger Kerstein, MD, Merlinda FrederickFACOG [facility], [facility] 10/04/2017, 1:25 AM

## 2017-10-04 NOTE — Anesthesia Procedure Notes (Signed)
Epidural Patient location during procedure: OB Start time: 10/03/2017 11:56 PM End time: 10/04/2017 12:07 AM  Staffing Anesthesiologist: Yves Dillarroll, Arnez Stoneking, MD Performed: anesthesiologist   Preanesthetic Checklist Completed: patient identified, site marked, surgical consent, pre-op evaluation, timeout performed, IV checked, risks and benefits discussed and monitors and equipment checked  Epidural Patient position: sitting Prep: Betadine Patient monitoring: heart rate, continuous pulse ox and blood pressure Approach: midline Location: L3-L4 Injection technique: LOR air  Needle:  Needle type: Tuohy  Needle gauge: 17 G Needle length: 9 cm and 9 Catheter type: closed end flexible Catheter size: 19 Gauge Test dose: negative and 1.5% lidocaine with Epi 1:200 K  Assessment Events: blood not aspirated, injection not painful, no injection resistance, negative IV test and no paresthesia  Additional Notes Time out called.  Patient placed in sitting position.  Back prepped and draped in sterile fashion.  A skin wheal was made in the L3-L4 interspace with 1% Lidocaine plain.Marland Kitchen.  A 17G Tuohy needle was advanced into the epidural space by a loss of resistance technique.  No blood or paresthesias.  The catheter was advanced 3 cm and the TD was negative.  The patient tolerated the procedure well.  The catheter was affixed to the back in sterile fashion.Reason for block:procedure for pain

## 2017-10-04 NOTE — Anesthesia Postprocedure Evaluation (Signed)
Anesthesia Post Note  Patient: Catherine Perez L Sherod  Procedure(s) Performed: CESAREAN SECTION (N/A )  Patient location during evaluation: PACU Anesthesia Type: Epidural Level of consciousness: awake, awake and alert and oriented Pain management: pain level controlled Vital Signs Assessment: post-procedure vital signs reviewed and stable Respiratory status: spontaneous breathing Cardiovascular status: blood pressure returned to baseline Postop Assessment: no headache and no backache Anesthetic complications: no     Last Vitals:  Vitals:   10/04/17 0630 10/04/17 0645  BP: 129/75 125/83  Pulse: 99 97  Resp: 15 16  Temp:    SpO2: 93% 94%    Last Pain:  Vitals:   10/04/17 0545  TempSrc: Oral  PainSc: 2                  Liani Caris Lawerance CruelStarr

## 2017-10-04 NOTE — Progress Notes (Signed)
Pt transported to OR2 via bed by RN for C/S.

## 2017-10-04 NOTE — Progress Notes (Signed)
Subjective:  Patient comfortable with epidural in place  Objective:   Vitals: Blood pressure (!) 141/78, pulse (!) 112, temperature 98.9 F (37.2 C), resp. rate 18, height 5\' 3"  (1.6 m), weight 191 lb (86.6 kg), last menstrual period 12/28/2016, SpO2 98 %. General: NAD Abdomen: Gravid, non-tender Cervical Exam:  Dilation: 4.5 Effacement (%): 90 Cervical Position: Posterior Station: -1 Presentation: Vertex Exam by:: A  Zedekiah Hinderman  FHT: 150, moderate, +accels, late deceleration Toco: q3-48min  Results for orders placed or performed during the hospital encounter of 10/03/17 (from the past 24 hour(s))  CBC     Status: Abnormal   Collection Time: 10/03/17  5:00 AM  Result Value Ref Range   WBC 14.7 (H) 3.6 - 11.0 K/uL   RBC 4.28 3.80 - 5.20 MIL/uL   Hemoglobin 13.2 12.0 - 16.0 g/dL   HCT 45.4 09.8 - 11.9 %   MCV 88.7 80.0 - 100.0 fL   MCH 30.9 26.0 - 34.0 pg   MCHC 34.9 32.0 - 36.0 g/dL   RDW 14.7 (H) 82.9 - 56.2 %   Platelets 323 150 - 440 K/uL  RPR     Status: None   Collection Time: 10/03/17  5:00 AM  Result Value Ref Range   RPR Ser Ql Non Reactive Non Reactive  Glucose, capillary     Status: None   Collection Time: 10/03/17  6:03 AM  Result Value Ref Range   Glucose-Capillary 96 70 - 99 mg/dL  Comprehensive metabolic panel     Status: Abnormal   Collection Time: 10/03/17  6:52 AM  Result Value Ref Range   Sodium 137 135 - 145 mmol/L   Potassium 4.0 3.5 - 5.1 mmol/L   Chloride 106 98 - 111 mmol/L   CO2 23 22 - 32 mmol/L   Glucose, Bld 90 70 - 99 mg/dL   BUN 11 6 - 20 mg/dL   Creatinine, Ser 1.30 0.44 - 1.00 mg/dL   Calcium 9.3 8.9 - 86.5 mg/dL   Total Protein 6.3 (L) 6.5 - 8.1 g/dL   Albumin 3.1 (L) 3.5 - 5.0 g/dL   AST 19 15 - 41 U/L   ALT 20 0 - 44 U/L   Alkaline Phosphatase 94 38 - 126 U/L   Total Bilirubin 0.4 0.3 - 1.2 mg/dL   GFR calc non Af Amer >60 >60 mL/min   GFR calc Af Amer >60 >60 mL/min   Anion gap 8 5 - 15  Type and screen     Status: None     Collection Time: 10/03/17  6:52 AM  Result Value Ref Range   ABO/RH(D) A POS    Antibody Screen NEG    Sample Expiration      10/06/2017 Performed at [facility], [address] [place], [place] [address]   Glucose, capillary     Status: None   Collection Time: 10/03/17  7:58 AM  Result Value Ref Range   Glucose-Capillary 83 70 - 99 mg/dL   Comment 1 Notify RN   Protein / creatinine ratio, urine     Status: Abnormal   Collection Time: 10/03/17  8:00 AM  Result Value Ref Range   Creatinine, Urine 44 mg/dL   Total Protein, Urine 9 mg/dL   Protein Creatinine Ratio 0.20 (H) 0.00 - 0.15 mg/mg[Cre]  Glucose, capillary     Status: None   Collection Time: 10/03/17 10:06 AM  Result Value Ref Range   Glucose-Capillary 77 70 - 99 mg/dL   Comment  1 Notify RN   Glucose, capillary     Status: Abnormal   Collection Time: 10/03/17 12:02 PM  Result Value Ref Range   Glucose-Capillary 69 (L) 70 - 99 mg/dL   Comment 1 Notify RN   Glucose, capillary     Status: None   Collection Time: 10/03/17 12:28 PM  Result Value Ref Range   Glucose-Capillary 97 70 - 99 mg/dL   Comment 1 Notify RN   Glucose, capillary     Status: None   Collection Time: 10/03/17  2:05 PM  Result Value Ref Range   Glucose-Capillary 80 70 - 99 mg/dL   Comment 1 Notify RN   Glucose, capillary     Status: Abnormal   Collection Time: 10/03/17  4:01 PM  Result Value Ref Range   Glucose-Capillary 102 (H) 70 - 99 mg/dL   Comment 1 Notify RN   Glucose, capillary     Status: Abnormal   Collection Time: 10/03/17  6:05 PM  Result Value Ref Range   Glucose-Capillary 135 (H) 70 - 99 mg/dL  Glucose, capillary     Status: Abnormal   Collection Time: 10/03/17  7:59 PM  Result Value Ref Range   Glucose-Capillary 66 (L) 70 - 99 mg/dL  Glucose, capillary     Status: None   Collection Time: 10/03/17  8:41 PM  Result Value Ref Range   Glucose-Capillary 73 70 - 99 mg/dL  Glucose, capillary     Status: Abnormal    Collection Time: 10/03/17 10:25 PM  Result Value Ref Range   Glucose-Capillary 69 (L) 70 - 99 mg/dL  CBC     Status: Abnormal   Collection Time: 10/03/17 10:47 PM  Result Value Ref Range   WBC 19.6 (H) 3.6 - 11.0 K/uL   RBC 4.57 3.80 - 5.20 MIL/uL   Hemoglobin 14.0 12.0 - 16.0 g/dL   HCT 11.940.4 14.735.0 - 82.947.0 %   MCV 88.4 80.0 - 100.0 fL   MCH 30.7 26.0 - 34.0 pg   MCHC 34.7 32.0 - 36.0 g/dL   RDW 56.215.1 (H) 13.011.5 - 86.514.5 %   Platelets 319 150 - 440 K/uL  Glucose, capillary     Status: None   Collection Time: 10/04/17 12:28 AM  Result Value Ref Range   Glucose-Capillary 92 70 - 99 mg/dL  Glucose, capillary     Status: None   Collection Time: 10/04/17  2:52 AM  Result Value Ref Range   Glucose-Capillary 96 70 - 99 mg/dL    Assessment:   37 y.o. G2P0010 6325w0d IOL GDM, GHTN  Plan:   1) Labor -pitocin stopped again  2) Fetus - category II - displaying late declarations again with restarting pitocin - The patient was counseled regarding risk and benefits to proceeding with Cesarean section to expedite delivery.  Risk of cesarean section were discussed including risk of bleeding and need for potential intraoperative or postoperative blood transfusion with a rate of approximately 5% quoted for all Cesarean sections, risk of injury to adjacent organs including but not limited to bowl and bladder, the need for additional surgical procedures to address such injuries, and the risk of infection.  The risk of continued attempts at vaginal delivery include but are note limited to worsening fetal or maternal status.  After consideration of options the patient is amenable to proceed with primary cesarean section for delivery.  3) GMD - SSI  4) GHTN - normotensive to mild range BP's  Vena AustriaAndreas Johnatan Baskette, MD, [facility], [facility] 10/04/2017, 4:39 AM

## 2017-10-04 NOTE — Anesthesia Post-op Follow-up Note (Signed)
  Anesthesia Pain Follow-up Note  Patient: Catherine Perez  Day #: 1  Date of Follow-up: 10/04/2017 Time: 7:19 AM  Last Vitals:  Vitals:   10/04/17 0630 10/04/17 0645  BP: 129/75 125/83  Pulse: 99 97  Resp: 15 16  Temp:    SpO2: 93% 94%    Level of Consciousness: alert  Pain: none   Side Effects:None  Catheter Site Exam:clean, dry, no drainage     Plan: D/C from anesthesia care at surgeon's request  Karoline Caldwelleana Eustace Hur

## 2017-10-04 NOTE — Anesthesia Post-op Follow-up Note (Signed)
Anesthesia QCDR form completed.        

## 2017-10-04 NOTE — Op Note (Signed)
Preoperative Diagnosis: 1) 37 y.o. G2P0010 at 2172w0d 2) Gestational diabetes insulin controlled 3) Gestational hypertension 4) Fetal intolerance to labor  Postoperative Diagnosis: 1) 37 y.o. G2P0010 at 372w0d 2) Gestational diabetes insulin controlled 3) Gestational hypertension 4) Fetal intolerance to labor  Operation Performed: Primary low transverse C-section via pfannenstiel skin incision  Indication: Patient presented for scheduled induction, during labor course began developing recurrent late decelerations and remote from delivery  Anesthesia: Epidural  Primary Surgeon: Vena AustriaAndreas Kinston Magnan, MD  Assistant: Epimenio SarinShelby Street  Preoperative Antibiotics: 2g ancef, 500mg  of azithromycin  Estimated Blood Loss: 800 mL  IV Fluids: 300mL  Urine Output:: 50mL  Drains or Tubes: Foley to gravity drainage, ON-Q catheter system  Implants: none  Specimens Removed: none  Complications: none  Intraoperative Findings:  Normal tubes ovaries and uterus.  Delivery resulted in the birth of a liveborn female, APGAR (1 MIN):   APGAR (5 MINS): 9 APGAR (10 MINS): 9, weight 8lbs 3oz or 3700g  Patient Condition: stable  Procedure in Detail:  Patient was taken to the operating room were she was administered regional anesthesia.  She was positioned in the supine position, prepped and draped in the  Usual sterile fashion.  Prior to proceeding with the case a time out was performed and the level of anesthetic was checked and noted to be adequate.  Utilizing the scalpel a pfannenstiel skin incision was made 2cm above the pubic symphysis and carried down sharply to the the level of the rectus fascia.  The fascia was incised in the midline using the scalpel and then extended using mayo scissors.  The superior border of the rectus fascia was grasped with two Kocher clamps and the underlying rectus muscles were dissected of the fascia using blunt dissection.  The median raphae was incised using Mayo scissors.   The  inferior border of the rectus fascia was dissected of the rectus muscles in a similar fashion.  The midline was identified, the peritoneum was entered bluntly and expanded using manual tractions.  The uterus was noted to be in a none rotated position.  Next the bladder blade was placed retracting the bladder caudally.  A bladder flap was not reated.  A low transverse incision was scored on the lower uterine segment.  The hysterotomy was entered bluntly using the operators finger.  The hysterotomy incision was extended using manual traction.  The operators hand was placed within the hysterotomy position noting the fetus to be within the OA position.  The vertex was grasped, flexed, brought to the incision, and delivered a traumatically using fundal pressure.  The remainder of the body delivered with ease.  The infant was suctioned, cord was clamped and cut before handing off to the awaiting neonatologist.  The placenta was delivered using manual extraction.  The uterus was exteriorized, wiped clean of clots and debris using two moist laps.  The hysterotomy was closed using a two layer closure of 0 Vicryl, with the first being a running locked, the second a vertical imbricating.  The uterus was returned to the abdomen.  The peritoneal gutters were wiped clean of clots and debris using two moist laps.  The hysterotomy incision was re-inspected noted to be hemostatic.  The rectus muscles were inspected noted to be hemostatic.  The superior border of the rectus fascia was grasped with a Kocher clamp.  The ON-Q trocars were then placed 4cm above the superior border of the incision and tunneled subfascially.  The introducers were removed and the catheters were threaded  through the sleeves after which the sleeves were removed.  The fascia was closed using a looped #1 PDS in a running fashion taking 1cm by 1cm bites.  The subcutaneous tissue was irrigated using warm saline, hemostasis achieved using the bovie.  The  subcutaneous dead space was less than 3cm and was not closed.  The skin was closed using Insorb staples.  Sponge needle and instrument counts were corrects times two.  The patient tolerated the procedure well and was taken to the recovery room in stable condition.

## 2017-10-05 LAB — CBC
HCT: 31.9 % — ABNORMAL LOW (ref 35.0–47.0)
HEMOGLOBIN: 11.1 g/dL — AB (ref 12.0–16.0)
MCH: 31.3 pg (ref 26.0–34.0)
MCHC: 34.7 g/dL (ref 32.0–36.0)
MCV: 90.2 fL (ref 80.0–100.0)
PLATELETS: 267 10*3/uL (ref 150–440)
RBC: 3.53 MIL/uL — AB (ref 3.80–5.20)
RDW: 14.9 % — ABNORMAL HIGH (ref 11.5–14.5)
WBC: 18.6 10*3/uL — AB (ref 3.6–11.0)

## 2017-10-05 NOTE — Progress Notes (Signed)
Admit Date: 10/03/2017 Today's Date: 10/05/2017  Subjective: Postpartum Day 1: Cesarean Delivery Patient reports incisional pain, tolerating PO and no problems voiding.    Objective: Vital signs in last 24 hours: Temp:  [98 F (36.7 C)-98.5 F (36.9 C)] 98.2 F (36.8 C) (08/01 0000) Pulse Rate:  [94-106] 102 (08/01 0743) Resp:  [18-20] 18 (08/01 0743) BP: (126-137)/(70-88) 132/88 (08/01 0743) SpO2:  [96 %-99 %] 99 % (08/01 0743)  Physical Exam:  General: alert, cooperative and no distress Lochia: appropriate Uterine Fundus: firm Incision: healing well, no significant drainage, no dehiscence, no significant erythema DVT Evaluation: No evidence of DVT seen on physical exam. Negative Homan's sign.  Recent Labs    10/03/17 2247 10/05/17 0542  HGB 14.0 11.1*  HCT 40.4 31.9*    Assessment/Plan: Status post Cesarean section. Doing well postoperatively.  Continue current care. GDM- no further checks currently needed.  Plan 6 week PP glucola HTN- resolved Min anemia.  PNV. Bottle feeding Considering IUD for contraception  Catherine LibraRobert Paul Eddy Perez 10/05/2017, 2:27 PM

## 2017-10-06 MED ORDER — OXYCODONE-ACETAMINOPHEN 5-325 MG PO TABS: 1.0000 | ORAL_TABLET | Freq: Four times a day (QID) | ORAL | 0 refills | Status: AC | PRN

## 2017-10-06 NOTE — Progress Notes (Signed)
Provided discharge paperwork and prescriptions and reviewed with the patient. Verified understanding by use of teach back method and patient verbalized understanding as well. Follow up appointment made for one week, next Friday 10/13/2017 for wound check. Mother discharged with infant to go home, husband to transport. Taken to visitor entrance via wheelchair.

## 2017-10-06 NOTE — Discharge Summary (Addendum)
OB Discharge Summary     Patient Name: Catherine Perez DOB: Oct 21, 1980 MRN: [id number]  Date of admission: 10/03/2017 Delivering MD: Vena Austria  Date of Delivery: 10/04/2017  Date of discharge: 10/06/2017  Admitting diagnosis: Induction Intrauterine pregnancy: [redacted]w[redacted]d     Secondary diagnosis: Gestational Hypertension and Gestational Diabetes medication controlled (A2)     Discharge diagnosis: Term Pregnancy Delivered                                                                                                Post partum procedures: none  Augmentation: Pitocin and Foley Balloon  Complications: fetal intolerance of labor, primary low transverse cesarean section  Hospital course:  Induction of Labor With Unplanned C/S  37 y.o. yo G2P1011 at [redacted]w[redacted]d was admitted on 10/03/2017 for planned induction of labor. Patient had a labor course significant for fetal late decelerations. Membrane Rupture Time/Date: 8:58 PM ,10/03/2017   The patient went for cesarean section due to Non-Reassuring FHR, and delivered a Viable infant,10/04/2017  Details of operation can be found in separate operative note. Patient had an uncomplicated postpartum course.  She is ambulating,tolerating a regular diet, passing flatus, and urinating well.  Patient is discharged home in stable condition 10/06/17 with precautions for blood pressure.  Physical exam  Vitals:   10/06/17 0400 10/06/17 0828 10/06/17 1136 10/06/17 1533  BP: (!) 148/99 (!) 146/95 124/85 137/85  Pulse: (!) 116 93 99 100  Resp: 20 20 18 18   Temp: 98.6 F (37 C) 98.1 F (36.7 C) 98.3 F (36.8 C) 98.4 F (36.9 C)  TempSrc: Oral Oral Oral Oral  SpO2: 97% 98% 99% 99%  Weight:      Height:       General: alert, cooperative and no distress Lochia: appropriate Uterine Fundus: firm Incision: Healing well with no significant drainage, Dressing is clean, dry, and intact DVT Evaluation: No evidence of DVT seen on physical exam.  Labs: Lab Results   Component Value Date   WBC 18.6 (H) 10/05/2017   HGB 11.1 (L) 10/05/2017   HCT 31.9 (L) 10/05/2017   MCV 90.2 10/05/2017   PLT 267 10/05/2017    Discharge instruction: per After Visit Summary.  Medications:  Allergies as of 10/06/2017      Reactions   Penicillins Other (See Comments)   Unsure happened as a child       Medication List    STOP taking these medications   ACCU-CHEK FASTCLIX LANCETS Misc   ferrous sulfate 325 (65 FE) MG tablet Commonly known as:  FERROUSUL   glucose blood test strip Commonly known as:  ACCU-CHEK GUIDE   insulin aspart 100 UNIT/ML FlexPen Commonly known as:  NOVOLOG   insulin glargine 100 unit/mL Sopn Commonly known as:  LANTUS     TAKE these medications   oxyCODONE-acetaminophen 5-325 MG tablet Commonly known as:  PERCOCET/ROXICET Take 1 tablet by mouth every 6 (six) hours as needed for up to 5 days for severe pain (pain scale 4-7).   prenatal multivitamin Tabs tablet Take 1 tablet by mouth daily at 12 noon.  Discharge Care Instructions  (From admission, onward)        Start     Ordered   10/06/17 0000  Discharge wound care:    Comments:  Keep incision dry, clean.   10/06/17 1559      Diet: routine diet  Activity: Advance as tolerated. Pelvic rest for 6 weeks.   Outpatient follow up: Follow-up Information    Vena AustriaStaebler, Andreas, MD In 1 week.   Specialty:  Obstetrics and Gynecology Why:  For wound re-check Contact information: [address] [place] [address] [phone]             Postpartum contraception: IUD Mirena vs vasectomy Rhogam Given postpartum: NA Rubella vaccine given postpartum: Rubella Immune Varicella vaccine given postpartum: Varicella Immune TDaP given antepartum or postpartum: given antepartum  Newborn Data: Live born female  Birth Weight: 8 lb 2.5 oz (3700 g) APGAR: 9, 9  Newborn Delivery   Birth date/time:  10/04/2017 04:57:00 Delivery type:  C-Section, Low  Transverse Trial of labor:  No C-section categorization:  Primary      Baby Feeding: formula  Disposition: home with mother  SIGNED:  Tresea MallJane Rahmah Mccamy, CNM 10/06/2017 4:10 PM

## 2017-10-06 NOTE — Progress Notes (Signed)
  Subjective:   Post Op Day 2: Patient reports not sleeping last night. She has been up walking which helps her back pain. She reports pain is generally well controlled with PO meds and On Q pump. She is tolerating PO intake. She is ambulating and voiding without difficulty. Discussed her mildly elevated blood pressure overnight and this morning and that I would like to have her stay today to observe for worsening or improving. She will try to get some rest today.    Objective:  Blood pressure (!) 146/95, pulse 93, temperature 98.1 F (36.7 C), temperature source Oral, resp. rate 20, height 5\' 3"  (1.6 m), weight 191 lb (86.6 kg), last menstrual period 12/28/2016, SpO2 98 %  General: NAD Pulmonary: no increased work of breathing Abdomen: non-distended, non-tender, fundus firm at level of umbilicus Incision: honeycomb dressing is C/D/I, On Q pump is C/D/I Extremities: no edema, no erythema, no tenderness    Assessment:   37 y.o. G2P1011 postoperativeday # 2   Plan:  1) Acute blood loss anemia - hemodynamically stable and asymptomatic - po ferrous sulfate  2) A positive, Rubella Immune, Varicella Immune  3) TDAP status: given antepartum   4) GDM: 2 hour gtt at 6 week PP visit  5) HTN: observe today for s/s  6) Formula feeding/Contraception: IUD vs Vasectomy  7) Disposition: continue routine postpartum C/Section care   Tresea MallJane Kolt Mcwhirter, CNM

## 2017-10-13 ENCOUNTER — Telehealth: Payer: Self-pay | Admitting: Obstetrics and Gynecology

## 2017-10-13 ENCOUNTER — Ambulatory Visit (INDEPENDENT_AMBULATORY_CARE_PROVIDER_SITE_OTHER): Payer: Managed Care, Other (non HMO) | Admitting: Obstetrics and Gynecology

## 2017-10-13 ENCOUNTER — Encounter: Payer: Self-pay | Admitting: Obstetrics and Gynecology

## 2017-10-13 VITALS — BP 132/88 | HR 104 | Wt 171.0 lb

## 2017-10-13 DIAGNOSIS — Z4889 Encounter for other specified surgical aftercare: Secondary | ICD-10-CM

## 2017-10-13 DIAGNOSIS — Z8632 Personal history of gestational diabetes: Secondary | ICD-10-CM

## 2017-10-13 NOTE — Telephone Encounter (Signed)
Patient is schedule 11/17/17 with AMS for mirena insertion

## 2017-10-13 NOTE — Progress Notes (Signed)
Postoperative Follow-up Patient presents post op from 1LTCS  1weeks ago for fetal intolerance to labor.  Subjective: Patient reports marked improvement in her preop symptoms. Eating a regular diet without difficulty. The patient is not having any pain.  Activity: normal activities of daily living.  Objective: Blood pressure 132/88, pulse (!) 104, weight 171 lb (77.6 kg), not currently breastfeeding.  Gen: NAD Abdomen: soft, non-tender, non-distended, incision D/C/I some mild ecchymoses around ON-Q catheter sites Ext: no edema  Admission on 10/03/2017, Discharged on 10/06/2017  Component Date Value Ref Range Status  . WBC 10/03/2017 14.7* 3.6 - 11.0 K/uL Final  . RBC 10/03/2017 4.28  3.80 - 5.20 MIL/uL Final  . Hemoglobin 10/03/2017 13.2  12.0 - 16.0 g/dL Final  . HCT 10/03/2017 38.0  35.0 - 47.0 % Final  . MCV 10/03/2017 88.7  80.0 - 100.0 fL Final  . MCH 10/03/2017 30.9  26.0 - 34.0 pg Final  . MCHC 10/03/2017 34.9  32.0 - 36.0 g/dL Final  . RDW 10/03/2017 15.0* 11.5 - 14.5 % Final  . Platelets 10/03/2017 323  150 - 440 K/uL Final   Performed at [facility], [address] [place], [place] [address]  . RPR Ser Ql 10/03/2017 Non Reactive  Non Reactive Final   Comment: (NOTE) Performed At: [facility] [address] [place], [place] [id number] Rush Farmer MD LE:[id number]   . Glucose-Capillary 10/03/2017 96  70 - 99 mg/dL Final  . Sodium 10/03/2017 137  135 - 145 mmol/L Final  . Potassium 10/03/2017 4.0  3.5 - 5.1 mmol/L Final  . Chloride 10/03/2017 106  98 - 111 mmol/L Final  . CO2 10/03/2017 23  22 - 32 mmol/L Final  . Glucose, Bld 10/03/2017 90  70 - 99 mg/dL Final  . BUN 10/03/2017 11  6 - 20 mg/dL Final  . Creatinine, Ser 10/03/2017 0.45  0.44 - 1.00 mg/dL Final  . Calcium 10/03/2017 9.3  8.9 - 10.3 mg/dL Final  . Total Protein 10/03/2017 6.3* 6.5 - 8.1 g/dL Final  . Albumin 10/03/2017 3.1* 3.5 - 5.0 g/dL Final  . AST 10/03/2017 19  15 -  41 U/L Final  . ALT 10/03/2017 20  0 - 44 U/L Final  . Alkaline Phosphatase 10/03/2017 94  38 - 126 U/L Final  . Total Bilirubin 10/03/2017 0.4  0.3 - 1.2 mg/dL Final  . GFR calc non Af Amer 10/03/2017 >60  >60 mL/min Final  . GFR calc Af Amer 10/03/2017 >60  >60 mL/min Final   Comment: (NOTE) The eGFR has been calculated using the CKD EPI equation. This calculation has not been validated in all clinical situations. eGFR's persistently <60 mL/min signify possible Chronic Kidney Disease.   [name] gap 10/03/2017 8  5 - 15 Final   Performed at [facility], [address] [place], [place] [address]  . ABO/RH(D) 10/03/2017 A POS   Final  . Antibody Screen 10/03/2017 NEG   Final  . Sample Expiration 10/03/2017    Final                   Value:10/06/2017 Performed at [facility], [address], [place], [place] [address]   . Creatinine, Urine 10/03/2017 44  mg/dL Final  . Total Protein, Urine 10/03/2017 9  mg/dL Final   NO NORMAL RANGE ESTABLISHED FOR THIS TEST  . Protein Creatinine Ratio 10/03/2017 0.20* 0.00 - 0.15 mg/mg[Cre] Final   Performed at [facility], [address] [place], [place] [address]  . Glucose-Capillary 10/03/2017 83  70 - 99 mg/dL Final  . Comment 1 10/03/2017 Notify RN   Final  . Glucose-Capillary 10/03/2017 77  70 - 99 mg/dL Final  . Comment 1 10/03/2017 Notify RN   Final  . Glucose-Capillary 10/03/2017 69* 70 - 99 mg/dL Final  . Comment 1 10/03/2017 Notify RN   Final  . Glucose-Capillary 10/03/2017 97  70 - 99 mg/dL Final  . Comment 1 10/03/2017 Notify RN   Final  . Glucose-Capillary 10/03/2017 80  70 - 99 mg/dL Final  . Comment 1 10/03/2017 Notify RN   Final  . Glucose-Capillary 10/03/2017 102* 70 - 99 mg/dL Final  . Comment 1 10/03/2017 Notify RN   Final  . Glucose-Capillary 10/03/2017 135* 70 - 99 mg/dL Final  . GBS 09/08/2017 Negative   Final  . Glucose-Capillary 10/03/2017 66* 70 - 99 mg/dL Final  . Glucose-Capillary  10/03/2017 73  70 - 99 mg/dL Final  . Glucose-Capillary 10/03/2017 69* 70 - 99 mg/dL Final  . WBC 10/03/2017 19.6* 3.6 - 11.0 K/uL Final  . RBC 10/03/2017 4.57  3.80 - 5.20 MIL/uL Final  . Hemoglobin 10/03/2017 14.0  12.0 - 16.0 g/dL Final  . HCT 10/03/2017 40.4  35.0 - 47.0 % Final  . MCV 10/03/2017 88.4  80.0 - 100.0 fL Final  . MCH 10/03/2017 30.7  26.0 - 34.0 pg Final  . MCHC 10/03/2017 34.7  32.0 - 36.0 g/dL Final  . RDW 10/03/2017 15.1* 11.5 - 14.5 % Final  . Platelets 10/03/2017 319  150 - 440 K/uL Final   Performed at [facility], [address] [place], [place] [address]  . Glucose-Capillary 10/04/2017 92  70 - 99 mg/dL Final  . Glucose-Capillary 10/04/2017 96  70 - 99 mg/dL Final  . Glucose-Capillary 10/04/2017 88  70 - 99 mg/dL Final  . WBC 10/05/2017 18.6* 3.6 - 11.0 K/uL Final  . RBC 10/05/2017 3.53* 3.80 - 5.20 MIL/uL Final  . Hemoglobin 10/05/2017 11.1* 12.0 - 16.0 g/dL Final  . HCT 10/05/2017 31.9* 35.0 - 47.0 % Final  . MCV 10/05/2017 90.2  80.0 - 100.0 fL Final  . MCH 10/05/2017 31.3  26.0 - 34.0 pg Final  . MCHC 10/05/2017 34.7  32.0 - 36.0 g/dL Final  . RDW 10/05/2017 14.9* 11.5 - 14.5 % Final  . Platelets 10/05/2017 267  150 - 440 K/uL Final   Performed at [facility], [address] [place], [place] [address]    Assessment: 37 y.o. s/p 1LTCS stable  Plan: Patient has done well after surgery with no apparent complications.  I have discussed the post-operative course to date, and the expected progress moving forward.  The patient understands what complications to be concerned about.  I will see the patient in routine follow up, or sooner if needed.    Activity plan: No heavy lifting, pelvic rest  -  Plans IUD for postpartum contraception - 2-hr OGTT secondary to GDM  Return in about 5 weeks (around 11/17/2017) for 6 week postpartum, 2-hr glucose tolerance test, and Mirena placement.   Malachy Mood, MD, [facility],  [place] 10/13/2017, 11:43 AM

## 2017-10-16 NOTE — Telephone Encounter (Signed)
Noted. Will order to arrive by apt date/time. 

## 2017-11-13 NOTE — Telephone Encounter (Signed)
Patient r/s to 11/21/17 for Mirena insertion

## 2017-11-13 NOTE — Telephone Encounter (Signed)
Spoke with patient to r/s due to AMS out of office 11/17/17. Patient wish to cancel due to insurance being out of network

## 2017-11-17 ENCOUNTER — Other Ambulatory Visit: Payer: Managed Care, Other (non HMO)

## 2017-11-17 ENCOUNTER — Ambulatory Visit: Payer: Managed Care, Other (non HMO) | Admitting: Obstetrics and Gynecology

## 2017-11-21 ENCOUNTER — Other Ambulatory Visit: Payer: Managed Care, Other (non HMO)

## 2017-11-21 ENCOUNTER — Encounter: Payer: Self-pay | Admitting: Obstetrics and Gynecology

## 2017-11-21 ENCOUNTER — Ambulatory Visit (INDEPENDENT_AMBULATORY_CARE_PROVIDER_SITE_OTHER): Payer: Managed Care, Other (non HMO) | Admitting: Obstetrics and Gynecology

## 2017-11-21 DIAGNOSIS — Z8632 Personal history of gestational diabetes: Secondary | ICD-10-CM

## 2017-11-21 DIAGNOSIS — Z4889 Encounter for other specified surgical aftercare: Secondary | ICD-10-CM

## 2017-11-21 NOTE — Progress Notes (Signed)
Postpartum Visit  Chief Complaint:  Chief Complaint  Patient presents with  . Postpartum Care    C/S 7/31  . 2 hour post GTT    History of Present Illness: Patient is a 37 y.o. G2P1011 presents for postpartum visit.  Date of delivery:  Cesarean Section: Fetal intolerance to labor Pregnancy or labor problems:  Yes GDM, GHTN Any problems since the delivery:  no  Newborn Details:  SINGLETON :  1. BabyGender female. Birth weight: 8lbs 3oz, 3700g Maternal Details:  Breast or formula feeding: plans to bottle feed Intercourse: No  Contraception after delivery: No  Any bowel or bladder issues: No  Post partum depression/anxiety noted:  no Edinburgh Post-Partum Depression Score:0 Date of last PAP: 03/20/17  NIL and HR HPV negative   Review of Systems: Review of Systems  Constitutional: Negative.   Gastrointestinal: Negative.   Genitourinary: Negative.   Skin: Negative.   Psychiatric/Behavioral: Negative.     The following portions of the patient's history were reviewed and updated as appropriate: allergies, current medications, past family history, past medical history, past social history, past surgical history and problem list.  Past Medical History:  Past Medical History:  Diagnosis Date  . Gestational diabetes   . No known health problems     Past Surgical History:  Past Surgical History:  Procedure Laterality Date  . CESAREAN SECTION N/A 10/04/2017   Procedure: CESAREAN SECTION;  Surgeon: Vena Austria, MD;  Location: [facility] ORS;  Service: Obstetrics;  Laterality: N/A;  . DILATION AND CURETTAGE OF UTERUS     SAB    Family History:  Family History  Problem Relation Age of Onset  . Cancer Neg Hx   . Diabetes Neg Hx   . Heart disease Neg Hx   . Stroke Neg Hx     Social History:  Social History   Socioeconomic History  . Marital status: Single    Spouse name: Not on file  . Number of children: Not on file  . Years of education: Not on file  .  Highest education level: Not on file  Occupational History  . Not on file  Social Needs  . Financial resource strain: Not on file  . Food insecurity:    Worry: Not on file    Inability: Not on file  . Transportation needs:    Medical: Not on file    Non-medical: Not on file  Tobacco Use  . Smoking status: Current Every Day Smoker    Packs/day: 0.50    Types: Cigarettes  . Smokeless tobacco: Never Used  Substance and Sexual Activity  . Alcohol use: No    Frequency: Never  . Drug use: No  . Sexual activity: Yes    Birth control/protection: None  Lifestyle  . Physical activity:    Days per week: Not on file    Minutes per session: Not on file  . Stress: Not on file  Relationships  . Social connections:    Talks on phone: Not on file    Gets together: Not on file    Attends religious service: Not on file    Active member of club or organization: Not on file    Attends meetings of clubs or organizations: Not on file    Relationship status: Not on file  . Intimate partner violence:    Fear of current or ex partner: Not on file    Emotionally abused: Not on file    Physically abused: Not on  file    Forced sexual activity: Not on file  Other Topics Concern  . Not on file  Social History Narrative  . Not on file    Allergies:  Allergies  Allergen Reactions  . Penicillins Other (See Comments)    Unsure happened as a child      Medications: Prior to Admission medications   Medication Sig Start Date End Date Taking? Authorizing Provider  Prenatal Vit-Fe Fumarate-FA (PRENATAL MULTIVITAMIN) TABS tablet Take 1 tablet by mouth daily at 12 noon.    [provider]    Physical Exam Blood pressure 122/68, pulse (!) 104, weight 163 lb (73.9 kg), not currently breastfeeding.  General: NAD HEENT: normocephalic, anicteric Pulmonary: No increased work of breathing Abdomen: NABS, soft, non-tender, non-distended.  Umbilicus without lesions.  No hepatomegaly,  splenomegaly or masses palpable. No evidence of hernia. Incision well healed Genitourinary:  External: Normal external female genitalia.  Normal urethral meatus, normal  Bartholin's and Skene's glands.    Vagina: Normal vaginal mucosa, no evidence of prolapse.    Cervix: Grossly normal in appearance, no bleeding  Uterus: Non-enlarged, mobile, normal contour.  No CMT  Adnexa: ovaries non-enlarged, no adnexal masses  Rectal: deferred Extremities: no edema, erythema, or tenderness Neurologic: Grossly intact Psychiatric: mood appropriate, affect full    Assessment: 37 y.o. G2P1011 presenting for 6 week postpartum visit  Plan: Problem List Items Addressed This Visit    None    Visit Diagnoses    6 weeks postpartum follow-up    -  Primary   Postoperative visit          1) Contraception - Education given regarding options for contraception, as well as compatibility with breast feeding if applicable.  Patient plans on OCP (estrogen/progesterone) for contraception. - samples and Rx of LoLoestrin Fe  2)  Pap - ASCCP guidelines and rational discussed.  ASCCP guidelines and rational discussed.  Patient opts for every 3 years screening interval  3) Patient underwent screening for postpartum depression with no signs of depression  4) GDM - 2-hr OGTT today  5) Return in about 1 year (around 11/22/2018) for annual.   Vena AustriaAndreas Cristi Gwynn, MD, Merlinda FrederickFACOG [facility], [facility] 11/21/2017, 8:52 AM

## 2017-11-22 LAB — GLUCOSE TOLERANCE, 2 HOURS
GLUCOSE FASTING GTT: 91 mg/dL (ref 65–99)
Glucose, 2 hour: 78 mg/dL (ref 65–139)

## 2017-11-24 NOTE — Telephone Encounter (Signed)
Pt decided on OCP's. Mirena not used.

## 2018-01-18 ENCOUNTER — Other Ambulatory Visit: Payer: Self-pay | Admitting: Obstetrics and Gynecology

## 2018-01-18 MED ORDER — NORETHIN-ETH ESTRAD-FE BIPHAS 1 MG-10 MCG / 10 MCG PO TABS: 1.0000 | ORAL_TABLET | Freq: Every day | ORAL | 3 refills | Status: DC

## 2018-06-27 ENCOUNTER — Other Ambulatory Visit: Payer: Self-pay

## 2018-06-27 MED ORDER — NORETHIN-ETH ESTRAD-FE BIPHAS 1 MG-10 MCG / 10 MCG PO TABS: 1.0000 | ORAL_TABLET | Freq: Every day | ORAL | 2 refills | Status: DC

## 2020-05-22 ENCOUNTER — Other Ambulatory Visit: Payer: Self-pay

## 2020-05-22 ENCOUNTER — Emergency Department
Admission: EM | Admit: 2020-05-22 | Discharge: 2020-05-22 | Disposition: A | Discharge status: Home / Self Care | Payer: Managed Care, Other (non HMO) | Attending: Emergency Medicine | Admitting: Emergency Medicine

## 2020-05-22 DIAGNOSIS — S61213A Laceration without foreign body of left middle finger without damage to nail, initial encounter: Secondary | ICD-10-CM | POA: Insufficient documentation

## 2020-05-22 DIAGNOSIS — F17210 Nicotine dependence, cigarettes, uncomplicated: Secondary | ICD-10-CM | POA: Insufficient documentation

## 2020-05-22 DIAGNOSIS — W268XXA Contact with other sharp object(s), not elsewhere classified, initial encounter: Secondary | ICD-10-CM | POA: Insufficient documentation

## 2020-05-22 MED ORDER — SULFAMETHOXAZOLE-TRIMETHOPRIM 800-160 MG PO TABS: 1.0000 | ORAL_TABLET | Freq: Two times a day (BID) | ORAL | 0 refills | Status: DC

## 2020-05-22 MED ORDER — LIDOCAINE HCL (PF) 1 % IJ SOLN
10.0000 mL | Freq: Once | INTRAMUSCULAR | Status: DC
  Filled 2020-05-22: qty 10

## 2020-05-22 NOTE — ED Provider Notes (Signed)
[facility] Emergency Department Provider Note  ____________________________________________  Time seen: Approximately 6:43 PM  I have reviewed the triage vital signs and the nursing notes.   HISTORY  Chief Complaint Laceration    HPI Catherine Perez is a 40 y.o. female presents the emergency department complaining of laceration to the middle finger of the left hand.  Patient accidentally cut her finger with a laser blade.  Patient wrapped the digit prior to arrival and had good cessation of bleeding.  No loss of range of motion.  No other injury or complaint.  Last tetanus shot was 2 years ago.         Past Medical History:  Diagnosis Date  . Gestational diabetes   . No known health problems     Patient Active Problem List   Diagnosis Date Noted  . Postpartum care following cesarean delivery 10/06/2017  . Diabetes in pregnancy 10/03/2017  . Gestational diabetes mellitus 08/15/2017  . Pregnancy with abnormal glucose tolerance test (GTT) 07/20/2017  . Anemia affecting pregnancy in third trimester 07/20/2017  . Supervision of high risk pregnancy, antepartum, second trimester 04/24/2017  . Advanced maternal age in multigravida, first trimester 03/10/2017    Past Surgical History:  Procedure Laterality Date  . CESAREAN SECTION N/A 10/04/2017   Procedure: CESAREAN SECTION;  Surgeon: Vena Austria, MD;  Location: [facility] ORS;  Service: Obstetrics;  Laterality: N/A;  . DILATION AND CURETTAGE OF UTERUS     SAB    Prior to Admission medications   Medication Sig Start Date End Date Taking? Authorizing Provider  sulfamethoxazole-trimethoprim (BACTRIM DS) 800-160 MG tablet Take 1 tablet by mouth 2 (two) times daily. 05/22/20  Yes , Delorise Royals, PA-C  Norethindrone-Ethinyl Estradiol-Fe Biphas (LO LOESTRIN FE) 1 MG-10 MCG / 10 MCG tablet Take 1 tablet by mouth daily. 06/27/18 09/19/18  Vena Austria, MD  Prenatal Vit-Fe Fumarate-FA (PRENATAL  MULTIVITAMIN) TABS tablet Take 1 tablet by mouth daily at 12 noon.    [provider]    Allergies Penicillins  Family History  Problem Relation Age of Onset  . Cancer Neg Hx   . Diabetes Neg Hx   . Heart disease Neg Hx   . Stroke Neg Hx     Social History Social History   Tobacco Use  . Smoking status: Current Every Day Smoker    Packs/day: 0.50    Types: Cigarettes  . Smokeless tobacco: Never Used  Vaping Use  . Vaping Use: Never used  Substance Use Topics  . Alcohol use: No  . Drug use: No     Review of Systems  Constitutional: No fever/chills Eyes: No visual changes. No discharge ENT: No upper respiratory complaints. Cardiovascular: no chest pain. Respiratory: no cough. No SOB. Gastrointestinal: No abdominal pain.  No nausea, no vomiting.  No diarrhea.  No constipation. Musculoskeletal: Laceration to the middle finger left hand Skin: Negative for rash, abrasions, lacerations, ecchymosis. Neurological: Negative for headaches, focal weakness or numbness.  10 System ROS otherwise negative.  ____________________________________________   PHYSICAL EXAM:  VITAL SIGNS: ED Triage Vitals  Enc Vitals Group     BP 05/22/20 1658 122/81     Pulse Rate 05/22/20 1658 (!) 105     Resp 05/22/20 1658 18     Temp 05/22/20 1658 98.1 F (36.7 C)     Temp Source 05/22/20 1658 Oral     SpO2 --      Weight --      Height --  Head Circumference --      Peak Flow --      Pain Score 05/22/20 1700 3     Pain Loc --      Pain Edu? --      Excl. in GC? --      Constitutional: Alert and oriented. Well appearing and in no acute distress. Eyes: Conjunctivae are normal. PERRL. EOMI. Head: Atraumatic. ENT:      Ears:       Nose: No congestion/rhinnorhea.      Mouth/Throat: Mucous membranes are moist.  Neck: No stridor.    Cardiovascular: Normal rate, regular rhythm. Normal S1 and S2.  Good peripheral circulation. Respiratory: Normal respiratory effort  without tachypnea or retractions. Lungs CTAB. Good air entry to the bases with no decreased or absent breath sounds. Musculoskeletal: Full range of motion to all extremities. No gross deformities appreciated.  Visualization of the left hand reveals laceration running along the palmar aspect of the middle finger.  Edges are smooth in nature.  No active bleeding.  No visible foreign body.  Range of motion is preserved.  Sensation and capillary refill intact distally.  Laceration measures approximately 6 cm in length. Neurologic:  Normal speech and language. No gross focal neurologic deficits are appreciated.  Skin:  Skin is warm, dry and intact. No rash noted. Psychiatric: Mood and affect are normal. Speech and behavior are normal. Patient exhibits appropriate insight and judgement.   ____________________________________________   LABS (all labs ordered are listed, but only abnormal results are displayed)  Labs Reviewed - No data to display ____________________________________________  EKG   ____________________________________________  RADIOLOGY   No results found.  ____________________________________________    PROCEDURES  Procedure(s) performed:    Marland KitchenMarland KitchenLaceration Repair  Date/Time: 05/22/2020 7:35 PM Performed by: Racheal Patches, PA-C Authorized by: Racheal Patches, PA-C   Consent:    Consent obtained:  Verbal   Consent given by:  Patient   Risks discussed:  Infection, pain and poor wound healing Universal protocol:    Procedure explained and questions answered to patient or proxy's satisfaction: yes     Patient identity confirmed:  Verbally with patient Anesthesia:    Anesthesia method:  Nerve block   Block location:  Middle finger L hand   Block needle gauge:  27 G   Block anesthetic:  Lidocaine 1% w/o epi   Block technique:  Digital Block   Block injection procedure:  Anatomic landmarks identified, introduced needle, negative aspiration for blood  and incremental injection   Block outcome:  Anesthesia achieved Laceration details:    Location:  Finger   Finger location:  L long finger   Length (cm):  6 Pre-procedure details:    Preparation:  Patient was prepped and draped in usual sterile fashion Exploration:    Hemostasis achieved with:  Direct pressure   Imaging outcome: foreign body not noted     Wound exploration: wound explored through full range of motion and entire depth of wound visualized     Wound extent: no foreign bodies/material noted, no tendon damage noted, no underlying fracture noted and no vascular damage noted     Contaminated: no   Treatment:    Area cleansed with:  Povidone-iodine   Amount of cleaning:  Extensive   Irrigation solution:  Sterile saline   Irrigation volume:    Irrigation method:  Syringe Skin repair:    Repair method:  Sutures   Suture size:  4-0   Suture material:  Nylon  Suture technique:  Running locked   Number of sutures:  1 (1 running interlocked suture with 15 throws) Approximation:    Approximation:  Close Repair type:    Repair type:  Simple Post-procedure details:    Dressing:  Tube gauze and splint for protection   Procedure completion:  Tolerated well, no immediate complications      Medications  lidocaine (PF) (XYLOCAINE) 1 % injection 10 mL (has no administration in time range)     ____________________________________________   INITIAL IMPRESSION / ASSESSMENT AND PLAN / ED COURSE  Pertinent labs & imaging results that were available during my care of the patient were reviewed by me and considered in my medical decision making (see chart for details).  Review of the [place] CSRS was performed in accordance of the [facility] prior to dispensing any controlled drugs.           Patient's diagnosis is consistent with finger laceration.  Patient presented to emergency department after sustaining a laceration from a razor blade to the middle finger of the left hand.   This was closed as described above.  Patient tolerated well with no complication.  Tube gauze with splint for protection.  Wound care instructions discussed with the patient and her husband.  Follow-up with primary care in 7 to 10 days for suture removal..  Patient will have antibiotics prophylactically.  She is instructed to use a probiotic in addition to the antibiotic.  Tylenol and Motrin at home for any pain.  Patient is given ED precautions to return to the ED for any worsening or new symptoms.     ____________________________________________  FINAL CLINICAL IMPRESSION(S) / ED DIAGNOSES  Final diagnoses:  Laceration of left middle finger without foreign body without damage to nail, initial encounter      NEW MEDICATIONS STARTED DURING THIS VISIT:  ED Discharge Orders         Ordered    sulfamethoxazole-trimethoprim (BACTRIM DS) 800-160 MG tablet  2 times daily        05/22/20 1937              This chart was dictated using voice recognition software/Dragon. Despite best efforts to proofread, errors can occur which can change the meaning. Any change was purely unintentional.    Racheal Patches, PA-C 05/22/20 1939    Shaune Pollack, MD 05/25/20 [phone]

## 2020-05-22 NOTE — ED Triage Notes (Signed)
Pt come with c/o left middle finger laceration. Pt states she was cutting hair and grab her razor cutting her finger. Bandage applied and bleeding controlled.

## 2021-07-02 ENCOUNTER — Telehealth: Payer: Self-pay | Admitting: Emergency Medicine

## 2021-07-02 DIAGNOSIS — J019 Acute sinusitis, unspecified: Secondary | ICD-10-CM

## 2021-07-02 DIAGNOSIS — B9689 Other specified bacterial agents as the cause of diseases classified elsewhere: Secondary | ICD-10-CM

## 2021-07-02 MED ORDER — DOXYCYCLINE HYCLATE 100 MG PO TABS: 100.0000 mg | ORAL_TABLET | Freq: Two times a day (BID) | ORAL | 0 refills | Status: AC

## 2021-07-02 NOTE — Progress Notes (Signed)
E-Visit for Sinus Problems ? ?We are sorry that you are not feeling well.  Here is how we plan to help! ? ?Based on what you have shared with me it looks like you have sinusitis.  Sinusitis is inflammation and infection in the sinus cavities of the head.  Based on your presentation I believe you most likely have Acute Bacterial Sinusitis.  This is an infection caused by bacteria and is treated with antibiotics. I have prescribed Doxycycline 100mg  by mouth twice a day for 10 days. You may use an oral decongestant such as Mucinex D or if you have glaucoma or high blood pressure use plain Mucinex.  ? ?Saline nasal spray help and can safely be used as often as needed for congestion.  You can also try using saline irrigation, such as with a neti pot, several times a day while you are sick. Many neti pots come with salt packets premeasured to use to make saline. If you use your own salt, make sure it is kosher salt or sea salt (don't use table salt as it has iodine in it and you don't need that in your nose). Use distilled water to make saline. If you mix your own saline using your own salt, the recipe is 1/4 teaspoon salt in 1 cup warm water. Using saline irrigation can help prevent and treat sinus infections.  ? ?If you develop worsening sinus pain, fever or notice severe headache and vision changes, or if symptoms are not better after completion of antibiotic, please schedule an appointment with a health care provider.   ? ?Sinus infections are not as easily transmitted as other respiratory infection, however we still recommend that you avoid close contact with loved ones, especially the very young and elderly.  Remember to wash your hands thoroughly throughout the day as this is the number one way to prevent the spread of infection! ? ?Home Care: ?Only take medications as instructed by your medical team. ?Complete the entire course of an antibiotic. ?Do not take these medications with alcohol. ?A steam or ultrasonic  humidifier can help congestion.  You can place a towel over your head and breathe in the steam from hot water coming from a faucet. ?Avoid close contacts especially the very young and the elderly. ?Cover your mouth when you cough or sneeze. ?Always remember to wash your hands. ? ?Get Help Right Away If: ?You develop worsening fever or sinus pain. ?You develop a severe head ache or visual changes. ?Your symptoms persist after you have completed your treatment plan. ? ?Make sure you ?Understand these instructions. ?Will watch your condition. ?Will get help right away if you are not doing well or get worse. ? ?Thank you for choosing an e-visit. ? ?Your e-visit answers were reviewed by a board certified advanced clinical practitioner to complete your personal care plan. Depending upon the condition, your plan could have included both over the counter or prescription medications. ? ?Please review your pharmacy choice. Make sure the pharmacy is open so you can pick up prescription now. If there is a problem, you may contact your provider through [employer] and have the prescription routed to another pharmacy.  Your safety is important to [place]. If you have drug allergies check your prescription carefully.  ? ?For the next 24 hours you can use MyChart to ask questions about today's visit, request a non-urgent call back, or ask for a work or school excuse. ?You will get an email in the next two days asking  about your experience. I hope that your e-visit has been valuable and will speed your recovery. ? ?I have spent 5 minutes in review of e-visit questionnaire, review and updating patient chart, medical decision making and response to patient.  ? ?Willeen Cass, PhD, FNP-BC ?  ?

## 2021-08-23 LAB — FETAL NONSTRESS TEST

## 2022-03-21 ENCOUNTER — Other Ambulatory Visit: Payer: Self-pay | Admitting: Obstetrics and Gynecology

## 2022-03-21 DIAGNOSIS — Z1231 Encounter for screening mammogram for malignant neoplasm of breast: Secondary | ICD-10-CM

## 2023-07-12 ENCOUNTER — Encounter: Payer: Self-pay | Admitting: Nurse Practitioner

## 2023-07-12 ENCOUNTER — Ambulatory Visit: Payer: Self-pay | Admitting: Nurse Practitioner

## 2023-07-12 VITALS — BP 118/75 | HR 103 | Temp 98.4°F | Resp 17 | Ht 63.39 in | Wt 179.4 lb

## 2023-07-12 DIAGNOSIS — R7309 Other abnormal glucose: Secondary | ICD-10-CM

## 2023-07-12 DIAGNOSIS — R1011 Right upper quadrant pain: Secondary | ICD-10-CM | POA: Diagnosis not present

## 2023-07-12 DIAGNOSIS — Z7689 Persons encountering health services in other specified circumstances: Secondary | ICD-10-CM | POA: Diagnosis not present

## 2023-07-12 NOTE — Progress Notes (Signed)
 BP 118/75 (BP Location: Left Arm, Patient Position: Sitting, Cuff Size: Large)   Pulse (!) 103   Temp 98.4 F (36.9 C) (Oral)   Resp 17   Ht 5' 3.39" (1.61 m)   Wt 179 lb 6.4 oz (81.4 kg)   LMP 06/30/2023 (Exact Date)   SpO2 98%   BMI 31.39 kg/m    Subjective:    Patient ID: Catherine Perez, female    DOB: 19-Jan-1981, 43 y.o.   MRN: [id number]  HPI: Catherine Perez is a 43 y.o. female  Chief Complaint  Patient presents with   Chest Pain    Under right breast and feels it mostly after eating. Can be touch sensitive and feels like she needs to "work it out" for the pain to resolve. Ongoing for about a month.    Establish Care    No previous PCP   Obesity    Diets and exercise but unable to lose. Very active lifestyle but not helping. Failed compounded Semaglutide and was very sick for 2 months with it.     GI Problem    Wonders if the chest pain could be IBS as she uses the bathroom 4-5 times a day and very soft bm. At times can be worse and that will last about 2 days.    Patient presents to clinic to establish care with new PCP.  Introduced to Publishing rights manager role and practice setting.  All questions answered.  Discussed provider/patient relationship and expectations.  Patient reports a history of Gestational diabetes, migraines .  Former smoker and currently vaping.     Patient denies a history of: Hypertension, Elevated Cholesterol, Diabetes, Thyroid problems, Depression, Anxiety, Neurological problems, and Abdominal problems.    ABDOMINAL PAIN Patient states she is having abdominal pain feels like two ribs sitting on top of each other all the time.  When she turns to the right it gets worse.  When she eats it gets worse.  Feels like the upper right side of her abdominal increases in size.  States she feels like she poops 4x a day.  States it isn't solid.  Its not like liquid but it is soft.  Feels like it has been going for about 2 months.    She tried compounded  semaglutide which made her very sick.  She tried it for two months.  States she was nauseous and vomiting.  She was having dizziness and feeling woozy.  She quit taking it and symptoms resolved.     Active Ambulatory Problems    Diagnosis Date Noted   Advanced maternal age in multigravida, first trimester 03/10/2017   Supervision of high risk pregnancy, antepartum, second trimester 04/24/2017   Pregnancy with abnormal glucose tolerance test (GTT) 07/20/2017   Anemia affecting pregnancy in third trimester 07/20/2017   Gestational diabetes mellitus 08/15/2017   Diabetes in pregnancy 10/03/2017   Postpartum care following cesarean delivery 10/06/2017   Resolved Ambulatory Problems    Diagnosis Date Noted   Pregnancy, supervision, high-risk, first trimester 03/10/2017   Past Medical History:  Diagnosis Date   Gestational diabetes    No known health problems    Past Surgical History:  Procedure Laterality Date   CESAREAN SECTION N/A 10/04/2017   Procedure: CESAREAN SECTION;  Surgeon: Darl Edu, MD;  Location: [facility] ORS;  Service: Obstetrics;  Laterality: N/A;   DILATION AND CURETTAGE OF UTERUS     SAB   Family History  Problem Relation Age of Onset   Diabetes  Mellitus II Mother    Throat cancer Father    Rectal cancer Father    Cancer Neg Hx    Diabetes Neg Hx    Heart disease Neg Hx    Stroke Neg Hx      Review of Systems  Gastrointestinal:  Positive for abdominal pain.       Loose stools    Per HPI unless specifically indicated above     Objective:    BP 118/75 (BP Location: Left Arm, Patient Position: Sitting, Cuff Size: Large)   Pulse (!) 103   Temp 98.4 F (36.9 C) (Oral)   Resp 17   Ht 5' 3.39" (1.61 m)   Wt 179 lb 6.4 oz (81.4 kg)   LMP 06/30/2023 (Exact Date)   SpO2 98%   BMI 31.39 kg/m   Wt Readings from Last 3 Encounters:  07/12/23 179 lb 6.4 oz (81.4 kg)  11/21/17 163 lb (73.9 kg)  10/13/17 171 lb (77.6 kg)    Physical Exam Vitals and  nursing note reviewed.  Constitutional:      General: She is not in acute distress.    Appearance: Normal appearance. She is normal weight. She is not ill-appearing, toxic-appearing or diaphoretic.  HENT:     Head: Normocephalic.     Right Ear: External ear normal.     Left Ear: External ear normal.     Nose: Nose normal.     Mouth/Throat:     Mouth: Mucous membranes are moist.     Pharynx: Oropharynx is clear.  Eyes:     General:        Right eye: No discharge.        Left eye: No discharge.     Extraocular Movements: Extraocular movements intact.     Conjunctiva/sclera: Conjunctivae normal.     Pupils: Pupils are equal, round, and reactive to light.  Cardiovascular:     Rate and Rhythm: Normal rate and regular rhythm.     Heart sounds: No murmur heard. Pulmonary:     Effort: Pulmonary effort is normal. No respiratory distress.     Breath sounds: Normal breath sounds. No wheezing or rales.  Abdominal:     Tenderness: There is abdominal tenderness in the right upper quadrant and right lower quadrant. There is no right CVA tenderness, left CVA tenderness, guarding or rebound. Negative signs include Murphy's sign, Rovsing's sign, McBurney's sign, psoas sign and obturator sign.  Musculoskeletal:     Cervical back: Normal range of motion and neck supple.  Skin:    General: Skin is warm and dry.     Capillary Refill: Capillary refill takes less than 2 seconds.  Neurological:     General: No focal deficit present.     Mental Status: She is alert and oriented to person, place, and time. Mental status is at baseline.  Psychiatric:        Mood and Affect: Mood normal.        Behavior: Behavior normal.        Thought Content: Thought content normal.        Judgment: Judgment normal.     Results for orders placed or performed in visit on 07/12/23  Comp Met (CMET)   Collection Time: 07/12/23  4:07 PM  Result Value Ref Range   Glucose 140 (H) 70 - 99 mg/dL   BUN 14 6 - 24 mg/dL    Creatinine, Ser 4.78 0.57 - 1.00 mg/dL   eGFR 295 >62 ZH/YQM/5.78  BUN/Creatinine Ratio 19 9 - 23   Sodium 138 134 - 144 mmol/L   Potassium 3.7 3.5 - 5.2 mmol/L   Chloride 100 96 - 106 mmol/L   CO2 21 20 - 29 mmol/L   Calcium  9.8 8.7 - 10.2 mg/dL   Total Protein 7.1 6.0 - 8.5 g/dL   Albumin 4.7 3.9 - 4.9 g/dL   Globulin, Total 2.4 1.5 - 4.5 g/dL   Bilirubin Total <1.4 0.0 - 1.2 mg/dL   Alkaline Phosphatase 66 44 - 121 IU/L   AST 25 0 - 40 IU/L   ALT 43 (H) 0 - 32 IU/L  HgB A1c   Collection Time: 07/12/23  4:07 PM  Result Value Ref Range   Hgb A1c MFr Bld 6.0 (H) 4.8 - 5.6 %   Est. average glucose Bld gHb Est-mCnc 126 mg/dL  Lipase   Collection Time: 07/12/23  4:07 PM  Result Value Ref Range   Lipase 33 14 - 72 U/L      Assessment & Plan:   Problem List Items Addressed This Visit   None Visit Diagnoses       RUQ pain    -  Primary   Will obtain US  of RUQ to evaluate. Will make recommendations based on lab results.   Relevant Orders   Lipase (Completed)     Elevated glucose       Labs ordered during visit today.  Will make recommendations based on results.   Relevant Orders   Comp Met (CMET) (Completed)   HgB A1c (Completed)     Encounter to establish care            Follow up plan: Return in about 1 month (around 08/12/2023) for Physical and Fasting labs.

## 2023-07-13 ENCOUNTER — Encounter: Payer: Self-pay | Admitting: Nurse Practitioner

## 2023-07-13 LAB — COMPREHENSIVE METABOLIC PANEL WITH GFR
ALT: 43 IU/L — ABNORMAL HIGH (ref 0–32)
AST: 25 IU/L (ref 0–40)
Albumin: 4.7 g/dL (ref 3.9–4.9)
Alkaline Phosphatase: 66 IU/L (ref 44–121)
BUN/Creatinine Ratio: 19 (ref 9–23)
BUN: 14 mg/dL (ref 6–24)
Bilirubin Total: <0.2 mg/dL (ref 0.0–1.2)
CO2: 21 mmol/L (ref 20–29)
Calcium: 9.8 mg/dL (ref 8.7–10.2)
Chloride: 100 mmol/L (ref 96–106)
Creatinine, Ser: 0.72 mg/dL (ref 0.57–1.00)
Globulin, Total: 2.4 g/dL (ref 1.5–4.5)
Glucose: 140 mg/dL — ABNORMAL HIGH (ref 70–99)
Potassium: 3.7 mmol/L (ref 3.5–5.2)
Sodium: 138 mmol/L (ref 134–144)
Total Protein: 7.1 g/dL (ref 6.0–8.5)
eGFR: 108 mL/min/{1.73_m2} (ref >59)

## 2023-07-13 LAB — LIPASE: Lipase: 33 U/L (ref 14–72)

## 2023-07-13 LAB — HEMOGLOBIN A1C
Est. average glucose Bld gHb Est-mCnc: 126 mg/dL
Hgb A1c MFr Bld: 6 % — ABNORMAL HIGH (ref 4.8–5.6)

## 2023-07-14 ENCOUNTER — Encounter: Payer: Self-pay | Admitting: Nurse Practitioner

## 2023-07-17 ENCOUNTER — Telehealth: Payer: Self-pay | Admitting: Nurse Practitioner

## 2023-07-17 NOTE — Telephone Encounter (Signed)
 Routing to provider to advise. Obesity discussed at recent visit.

## 2023-07-17 NOTE — Telephone Encounter (Signed)
 Copied from CRM [id number]. Topic: General - Other >> Jul 14, 2023  2:04 PM [place] B wrote: Reason for CRM: pt called back in regards to medication for weight loss. She isnt on anything for that now and wants to discuss what med she can use for this. Please cb

## 2023-07-18 ENCOUNTER — Telehealth: Payer: Self-pay | Admitting: Nurse Practitioner

## 2023-07-18 MED ORDER — METFORMIN HCL 500 MG PO TABS: 500.0000 mg | ORAL_TABLET | Freq: Two times a day (BID) | ORAL | 1 refills | Status: DC

## 2023-07-18 NOTE — Telephone Encounter (Signed)
 RX has been sent in and patient is aware. See mychart messages.

## 2023-07-18 NOTE — Telephone Encounter (Signed)
 Copied from CRM [id number]. Topic: Clinical - Medication Refill >> Jul 18, 2023  1:33 PM Ary Bitter R wrote: Medication: Metformin (new medication) Not sure of what strength   Has the patient contacted their pharmacy? No. New medication.  This is the patient's preferred pharmacy:  [facility] [address]), [place] - [address] [place]) [place] [address] Phone: [phone] Fax: [phone]  Is this the correct pharmacy for this prescription? Yes If no, delete pharmacy and type the correct one.   Has the prescription been filled recently? No  Is the patient out of the medication? Yes  Has the patient been seen for an appointment in the last year OR does the patient have an upcoming appointment? Yes  Can we respond through MyChart? Yes  Agent: Please be advised that Rx refills may take up to 3 business days. We ask that you follow-up with your pharmacy.

## 2023-07-19 NOTE — Telephone Encounter (Signed)
 Responded to patient via mychart

## 2023-07-20 MED ORDER — METFORMIN HCL 500 MG PO TABS: 500.0000 mg | ORAL_TABLET | Freq: Two times a day (BID) | ORAL | 1 refills | Status: DC

## 2023-08-14 ENCOUNTER — Other Ambulatory Visit (HOSPITAL_COMMUNITY)
Admission: RE | Admit: 2023-08-14 | Discharge: 2023-08-14 | Disposition: A | Discharge status: Home / Self Care | Source: Ambulatory Visit | Attending: Nurse Practitioner | Admitting: Nurse Practitioner

## 2023-08-14 ENCOUNTER — Ambulatory Visit: Admitting: Nurse Practitioner

## 2023-08-14 VITALS — BP 138/89 | HR 108 | Temp 100.2°F | Ht 65.0 in | Wt 174.8 lb

## 2023-08-14 DIAGNOSIS — Z1231 Encounter for screening mammogram for malignant neoplasm of breast: Secondary | ICD-10-CM | POA: Diagnosis not present

## 2023-08-14 DIAGNOSIS — Z8632 Personal history of gestational diabetes: Secondary | ICD-10-CM | POA: Insufficient documentation

## 2023-08-14 DIAGNOSIS — Z136 Encounter for screening for cardiovascular disorders: Secondary | ICD-10-CM

## 2023-08-14 DIAGNOSIS — W57XXXA Bitten or stung by nonvenomous insect and other nonvenomous arthropods, initial encounter: Secondary | ICD-10-CM

## 2023-08-14 DIAGNOSIS — Z0000 Encounter for general adult medical examination without abnormal findings: Secondary | ICD-10-CM | POA: Diagnosis present

## 2023-08-14 MED ORDER — DOXYCYCLINE HYCLATE 100 MG PO TABS: 100.0000 mg | ORAL_TABLET | Freq: Two times a day (BID) | ORAL | 0 refills | Status: DC

## 2023-08-14 NOTE — Progress Notes (Signed)
 BP 138/89   Pulse (!) 108   Temp 100.2 F (37.9 C) (Oral)   Ht 5\' 5"  (1.651 m)   Wt 174 lb 12.8 oz (79.3 kg)   LMP 08/07/2023 (Exact Date)   SpO2 98%   BMI 29.09 kg/m    Subjective:    Patient ID: Catherine Perez, female    DOB: Jun 06, 1980, 43 y.o.   MRN: [id number]  HPI: Catherine Perez is a 43 y.o. female presenting on 08/14/2023 for comprehensive medical examination. Current medical complaints include:tick bite  She currently lives with: Menopausal Symptoms: no  Patient states she is having body aches, feels like she ran a marathon.  Started yesterday afternoon.  No upper respiratory symptoms.  No fevers at home.  She has been cold today.  She pulled a tick off yesterday.  Having some RUQ pain that comes and goes.  No nausea, vomiting or diarrhea.  Depression Screen done today and results listed below:     08/14/2023    3:08 PM 07/12/2023    3:37 PM 08/07/2017    1:20 PM  Depression screen PHQ 2/9  Decreased Interest 0 0 0  Down, Depressed, Hopeless 0 0 0  PHQ - 2 Score 0 0 0  Altered sleeping 0    Tired, decreased energy 0    Change in appetite 0    Feeling bad or failure about yourself  0    Trouble concentrating 0    Moving slowly or fidgety/restless 0    Suicidal thoughts 0    PHQ-9 Score 0    Difficult doing work/chores Not difficult at all      The patient does not have a history of falls. I did complete a risk assessment for falls. A plan of care for falls was documented.   Past Medical History:  Past Medical History:  Diagnosis Date   Gestational diabetes    No known health problems     Surgical History:  Past Surgical History:  Procedure Laterality Date   CESAREAN SECTION N/A 10/04/2017   Procedure: CESAREAN SECTION;  Surgeon: Darl Edu, MD;  Location: [facility] ORS;  Service: Obstetrics;  Laterality: N/A;   DILATION AND CURETTAGE OF UTERUS     SAB    Medications:  Current Outpatient Medications on File Prior to Visit  Medication Sig    metFORMIN  (GLUCOPHAGE ) 500 MG tablet Take 1 tablet (500 mg total) by mouth 2 (two) times daily with a meal.   No current facility-administered medications on file prior to visit.    Allergies:  Allergies  Allergen Reactions   Penicillins Other (See Comments)    Unsure happened as a child      Social History:  Social History   Socioeconomic History   Marital status: Married    Spouse name: Harvest Lineman   Number of children: 1   Years of education: Not on file   Highest education level: Associate degree: academic program  Occupational History   Occupation: Optometrist: Research scientist (physical sciences) [employer]  Tobacco Use   Smoking status: Former    Average packs/day: 1 pack/day for 8.2 years (8.2 ttl pk-yrs)    Types: Cigarettes    Start date: 03/07/2014   Smokeless tobacco: Never  Vaping Use   Vaping status: Every Day   Substances: Nicotine, Flavoring  Substance and Sexual Activity   Alcohol use: Yes    Alcohol/week: 1.0 standard drink of alcohol    Types: 1 Glasses of wine per week  Drug use: Never   Sexual activity: Yes    Birth control/protection: None  Other Topics Concern   Not on file  Social History Narrative   Not on file   Social Drivers of Health   Financial Resource Strain: Low Risk  (08/14/2023)   Overall Financial Resource Strain (CARDIA)    Difficulty of Paying Living Expenses: Not hard at all  Food Insecurity: No Food Insecurity (08/14/2023)   Hunger Vital Sign    Worried About Running Out of Food in the Last Year: Never true    Ran Out of Food in the Last Year: Never true  Transportation Needs: No Transportation Needs (08/14/2023)   PRAPARE - Administrator, Civil Service (Medical): No    Lack of Transportation (Non-Medical): No  Physical Activity: Insufficiently Active (08/14/2023)   Exercise Vital Sign    Days of Exercise per Week: 4 days    Minutes of Exercise per Session: 30 min  Stress: No Stress Concern Present (08/14/2023)   [employer] of  Occupational Health - Occupational Stress Questionnaire    Feeling of Stress : Only a little  Social Connections: Moderately Integrated (08/14/2023)   Social Connection and Isolation Panel [NHANES]    Frequency of Communication with Friends and Family: More than three times a week    Frequency of Social Gatherings with Friends and Family: Three times a week    Attends Religious Services: 1 to 4 times per year    Active Member of Clubs or Organizations: No    Attends Banker Meetings: Never    Marital Status: Married  Catering manager Violence: Not At Risk (07/12/2023)   Humiliation, Afraid, Rape, and Kick questionnaire    Fear of Current or Ex-Partner: No    Emotionally Abused: No    Physically Abused: No    Sexually Abused: No   Social History   Tobacco Use  Smoking Status Former   Average packs/day: 1 pack/day for 8.2 years (8.2 ttl pk-yrs)   Types: Cigarettes   Start date: 03/07/2014  Smokeless Tobacco Never   Social History   Substance and Sexual Activity  Alcohol Use Yes   Alcohol/week: 1.0 standard drink of alcohol   Types: 1 Glasses of wine per week    Family History:  Family History  Problem Relation Age of Onset   Diabetes Mellitus II Mother    Cancer Mother    Throat cancer Father    Rectal cancer Father    Cancer Father    Cancer Sister    Cancer Sister    Cancer Brother    Cancer Son    Diabetes Neg Hx    Heart disease Neg Hx    Stroke Neg Hx     Past medical history, surgical history, medications, allergies, family history and social history reviewed with patient today and changes made to appropriate areas of the chart.   Review of Systems  Constitutional:  Positive for chills, fever and malaise/fatigue.       Body aches  Gastrointestinal:  Positive for abdominal pain. Negative for diarrhea, nausea and vomiting.  Skin:        Tick bite on back of neck   All other ROS negative except what is listed above and in the HPI.      Objective:      BP 138/89   Pulse (!) 108   Temp 100.2 F (37.9 C) (Oral)   Ht 5\' 5"  (1.651 m)   Wt 174 lb  12.8 oz (79.3 kg)   LMP 08/07/2023 (Exact Date)   SpO2 98%   BMI 29.09 kg/m   Wt Readings from Last 3 Encounters:  08/14/23 174 lb 12.8 oz (79.3 kg)  07/12/23 179 lb 6.4 oz (81.4 kg)  11/21/17 163 lb (73.9 kg)    Physical Exam Vitals and nursing note reviewed. Exam conducted with a chaperone present (Meme Layne, CMA).  Constitutional:      General: She is awake. She is not in acute distress.    Appearance: Normal appearance. She is well-developed. She is not ill-appearing.  HENT:     Head: Normocephalic and atraumatic.     Right Ear: Hearing, tympanic membrane, ear canal and external ear normal. No drainage.     Left Ear: Hearing, tympanic membrane, ear canal and external ear normal. No drainage.     Nose: Nose normal.     Right Sinus: No maxillary sinus tenderness or frontal sinus tenderness.     Left Sinus: No maxillary sinus tenderness or frontal sinus tenderness.     Mouth/Throat:     Mouth: Mucous membranes are moist.     Pharynx: Oropharynx is clear. Uvula midline. No pharyngeal swelling, oropharyngeal exudate or posterior oropharyngeal erythema.  Eyes:     General: Lids are normal.        Right eye: No discharge.        Left eye: No discharge.     Extraocular Movements: Extraocular movements intact.     Conjunctiva/sclera: Conjunctivae normal.     Pupils: Pupils are equal, round, and reactive to light.     Visual Fields: Right eye visual fields normal and left eye visual fields normal.  Neck:     Thyroid: No thyromegaly.     Vascular: No carotid bruit.     Trachea: Trachea normal.  Cardiovascular:     Rate and Rhythm: Normal rate and regular rhythm.     Heart sounds: Normal heart sounds. No murmur heard.    No gallop.  Pulmonary:     Effort: Pulmonary effort is normal. No accessory muscle usage or respiratory distress.     Breath sounds: Normal breath sounds.   Chest:  Breasts:    Right: Normal.     Left: Normal.  Abdominal:     General: Bowel sounds are normal.     Palpations: Abdomen is soft. There is no hepatomegaly or splenomegaly.     Tenderness: There is no abdominal tenderness.  Genitourinary:    Vagina: Normal.     Cervix: Normal.     Adnexa: Right adnexa normal and left adnexa normal.  Musculoskeletal:        General: Normal range of motion.     Cervical back: Normal range of motion and neck supple.     Right lower leg: No edema.     Left lower leg: No edema.  Lymphadenopathy:     Head:     Right side of head: No submental, submandibular, tonsillar, preauricular or posterior auricular adenopathy.     Left side of head: No submental, submandibular, tonsillar, preauricular or posterior auricular adenopathy.     Cervical: Cervical adenopathy present.     Right cervical: No superficial, deep or posterior cervical adenopathy.    Left cervical: Posterior cervical adenopathy present. No superficial or deep cervical adenopathy.     Upper Body:     Right upper body: No supraclavicular, axillary or pectoral adenopathy.     Left upper body: No supraclavicular, axillary or pectoral adenopathy.  Skin:    General: Skin is warm and dry.     Capillary Refill: Capillary refill takes less than 2 seconds.     Findings: No rash.       Neurological:     Mental Status: She is alert and oriented to person, place, and time.     Gait: Gait is intact.  Psychiatric:        Attention and Perception: Attention normal.        Mood and Affect: Mood normal.        Speech: Speech normal.        Behavior: Behavior normal. Behavior is cooperative.        Thought Content: Thought content normal.        Judgment: Judgment normal.     Results for orders placed or performed in visit on 07/12/23  Comp Met (CMET)   Collection Time: 07/12/23  4:07 PM  Result Value Ref Range   Glucose 140 (H) 70 - 99 mg/dL   BUN 14 6 - 24 mg/dL   Creatinine, Ser 1.61  0.57 - 1.00 mg/dL   eGFR 096 >04 VW/UJW/1.19   BUN/Creatinine Ratio 19 9 - 23   Sodium 138 134 - 144 mmol/L   Potassium 3.7 3.5 - 5.2 mmol/L   Chloride 100 96 - 106 mmol/L   CO2 21 20 - 29 mmol/L   Calcium  9.8 8.7 - 10.2 mg/dL   Total Protein 7.1 6.0 - 8.5 g/dL   Albumin 4.7 3.9 - 4.9 g/dL   Globulin, Total 2.4 1.5 - 4.5 g/dL   Bilirubin Total <1.4 0.0 - 1.2 mg/dL   Alkaline Phosphatase 66 44 - 121 IU/L   AST 25 0 - 40 IU/L   ALT 43 (H) 0 - 32 IU/L  HgB A1c   Collection Time: 07/12/23  4:07 PM  Result Value Ref Range   Hgb A1c MFr Bld 6.0 (H) 4.8 - 5.6 %   Est. average glucose Bld gHb Est-mCnc 126 mg/dL  Lipase   Collection Time: 07/12/23  4:07 PM  Result Value Ref Range   Lipase 33 14 - 72 U/L      Assessment & Plan:   Problem List Items Addressed This Visit   None Visit Diagnoses       Annual physical exam    -  Primary   Health maintenance reviewed during visit today.  Labs ordred.  Vaccines reviewed.  PAP done.   Relevant Orders   CBC with Differential/Platelet   Comprehensive metabolic panel with GFR   Lipid panel   TSH   Cytology - PAP     Screening for ischemic heart disease       Relevant Orders   Lipid panel     Encounter for screening mammogram for malignant neoplasm of breast       Relevant Orders   MM 3D SCREENING MAMMOGRAM BILATERAL BREAST     Tick bite, unspecified site, initial encounter       Will treat with doxycyline based on symptoms and vitals in office.  If not improved return to clinic for further testing.        Follow up plan: No follow-ups on file.   LABORATORY TESTING:  - Pap smear: pap done  IMMUNIZATIONS:   - Tdap: Tetanus vaccination status reviewed: last tetanus booster within 10 years. - Influenza: Postponed to flu season - Pneumovax: Not applicable - Prevnar: Not applicable - COVID: Not applicable - HPV: Not applicable - Shingrix vaccine: Not  applicable  SCREENING: -Mammogram: Ordered today  - Colonoscopy: Not  applicable  - Bone Density: Not applicable  -Hearing Test: Not applicable -Spirometry: Not applicable   PATIENT COUNSELING:   Advised to take 1 mg of folate supplement per day if capable of pregnancy.   Sexuality: Discussed sexually transmitted diseases, partner selection, use of condoms, avoidance of unintended pregnancy  and contraceptive alternatives.   Advised to avoid cigarette smoking.  I discussed with the patient that most people either abstain from alcohol or drink within safe limits (<=14/week and <=4 drinks/occasion for males, <=7/weeks and <= 3 drinks/occasion for females) and that the risk for alcohol disorders and other health effects rises proportionally with the number of drinks per week and how often a drinker exceeds daily limits.  Discussed cessation/primary prevention of drug use and availability of treatment for abuse.   Diet: Encouraged to adjust caloric intake to maintain  or achieve ideal body weight, to reduce intake of dietary saturated fat and total fat, to limit sodium intake by avoiding high sodium foods and not adding table salt, and to maintain adequate dietary potassium and calcium  preferably from fresh fruits, vegetables, and low-fat dairy products.    stressed the importance of regular exercise  Injury prevention: Discussed safety belts, safety helmets, smoke detector, smoking near bedding or upholstery.   Dental health: Discussed importance of regular tooth brushing, flossing, and dental visits.    NEXT PREVENTATIVE PHYSICAL DUE IN 1 YEAR. No follow-ups on file.

## 2023-08-15 ENCOUNTER — Ambulatory Visit: Payer: Self-pay | Admitting: Nurse Practitioner

## 2023-08-15 ENCOUNTER — Emergency Department
Admission: EM | Admit: 2023-08-15 | Discharge: 2023-08-15 | Disposition: A | Discharge status: Home / Self Care | Attending: Emergency Medicine | Admitting: Emergency Medicine

## 2023-08-15 ENCOUNTER — Emergency Department

## 2023-08-15 DIAGNOSIS — R1011 Right upper quadrant pain: Secondary | ICD-10-CM

## 2023-08-15 DIAGNOSIS — K760 Fatty (change of) liver, not elsewhere classified: Secondary | ICD-10-CM | POA: Insufficient documentation

## 2023-08-15 DIAGNOSIS — R748 Abnormal levels of other serum enzymes: Secondary | ICD-10-CM

## 2023-08-15 LAB — TSH: TSH: 0.803 u[IU]/mL (ref 0.450–4.500)

## 2023-08-15 LAB — COMPREHENSIVE METABOLIC PANEL WITH GFR
ALT: 168 IU/L — ABNORMAL HIGH (ref 0–32)
ALT: 135 U/L — ABNORMAL HIGH (ref 0–44)
AST: 115 IU/L — ABNORMAL HIGH (ref 0–40)
AST: 59 U/L — ABNORMAL HIGH (ref 15–41)
Albumin: 4.5 g/dL (ref 3.9–4.9)
Albumin: 4.1 g/dL (ref 3.5–5.0)
Alkaline Phosphatase: 102 IU/L (ref 44–121)
Alkaline Phosphatase: 71 U/L (ref 38–126)
Anion gap: 7 (ref 5–15)
BUN/Creatinine Ratio: 19 (ref 9–23)
BUN: 13 mg/dL (ref 6–24)
BUN: 10 mg/dL (ref 6–20)
Bilirubin Total: 0.2 mg/dL (ref 0.0–1.2)
CO2: 23 mmol/L (ref 20–29)
CO2: 27 mmol/L (ref 22–32)
Calcium: 9.6 mg/dL (ref 8.7–10.2)
Calcium: 9.1 mg/dL (ref 8.9–10.3)
Chloride: 99 mmol/L (ref 96–106)
Chloride: 106 mmol/L (ref 98–111)
Creatinine, Ser: 0.7 mg/dL (ref 0.57–1.00)
Creatinine, Ser: 0.66 mg/dL (ref 0.44–1.00)
GFR, Estimated: >60 mL/min (ref >60)
Globulin, Total: 2.6 g/dL (ref 1.5–4.5)
Glucose, Bld: 94 mg/dL (ref 70–99)
Glucose: 100 mg/dL — ABNORMAL HIGH (ref 70–99)
Potassium: 4.1 mmol/L (ref 3.5–5.2)
Potassium: 3.8 mmol/L (ref 3.5–5.1)
Sodium: 137 mmol/L (ref 134–144)
Sodium: 140 mmol/L (ref 135–145)
Total Bilirubin: 0.7 mg/dL (ref 0.0–1.2)
Total Protein: 7.1 g/dL (ref 6.0–8.5)
Total Protein: 7.1 g/dL (ref 6.5–8.1)
eGFR: 111 mL/min/{1.73_m2} (ref >59)

## 2023-08-15 LAB — LIPID PANEL
Chol/HDL Ratio: 3.8 ratio (ref 0.0–4.4)
Cholesterol, Total: 161 mg/dL (ref 100–199)
HDL: 42 mg/dL (ref >39)
LDL Chol Calc (NIH): 95 mg/dL (ref 0–99)
Triglycerides: 132 mg/dL (ref 0–149)
VLDL Cholesterol Cal: 24 mg/dL (ref 5–40)

## 2023-08-15 LAB — CBC
HCT: 40.1 % (ref 36.0–46.0)
Hemoglobin: 13.5 g/dL (ref 12.0–15.0)
MCH: 29.3 pg (ref 26.0–34.0)
MCHC: 33.7 g/dL (ref 30.0–36.0)
MCV: 87 fL (ref 80.0–100.0)
Platelets: 176 10*3/uL (ref 150–400)
RBC: 4.61 MIL/uL (ref 3.87–5.11)
RDW: 13.1 % (ref 11.5–15.5)
WBC: 5.6 10*3/uL (ref 4.0–10.5)
nRBC: 0 % (ref 0.0–0.2)

## 2023-08-15 LAB — URINALYSIS, ROUTINE W REFLEX MICROSCOPIC
Bilirubin Urine: NEGATIVE
Glucose, UA: NEGATIVE mg/dL
Hgb urine dipstick: NEGATIVE
Ketones, ur: NEGATIVE mg/dL
Leukocytes,Ua: NEGATIVE
Nitrite: NEGATIVE
Protein, ur: NEGATIVE mg/dL
Specific Gravity, Urine: 1.009 (ref 1.005–1.030)
pH: 5 (ref 5.0–8.0)

## 2023-08-15 LAB — CBC WITH DIFFERENTIAL/PLATELET
Basophils Absolute: 0 10*3/uL (ref 0.0–0.2)
Basos: 0 %
EOS (ABSOLUTE): 0.3 10*3/uL (ref 0.0–0.4)
Eos: 4 %
Hematocrit: 38.7 % (ref 34.0–46.6)
Hemoglobin: 13 g/dL (ref 11.1–15.9)
Immature Grans (Abs): 0.1 10*3/uL (ref 0.0–0.1)
Immature Granulocytes: 1 %
Lymphocytes Absolute: 0.8 10*3/uL (ref 0.7–3.1)
Lymphs: 10 %
MCH: 28.8 pg (ref 26.6–33.0)
MCHC: 33.6 g/dL (ref 31.5–35.7)
MCV: 86 fL (ref 79–97)
Monocytes Absolute: 0.5 10*3/uL (ref 0.1–0.9)
Monocytes: 6 %
Neutrophils Absolute: 5.9 10*3/uL (ref 1.4–7.0)
Neutrophils: 79 %
Platelets: 191 10*3/uL (ref 150–450)
RBC: 4.51 x10E6/uL (ref 3.77–5.28)
RDW: 13 % (ref 11.7–15.4)
WBC: 7.6 10*3/uL (ref 3.4–10.8)

## 2023-08-15 LAB — POC URINE PREG, ED: Preg Test, Ur: NEGATIVE

## 2023-08-15 LAB — LIPASE, BLOOD: Lipase: 31 U/L (ref 11–51)

## 2023-08-15 NOTE — Telephone Encounter (Signed)
 Patient was not able to get scheduled for US  until tomorrow.  Due to increase in liver enzymes, tachycardia, RUQ and lower back pain, I called patient and recommended she be seen in the ER for further evaluation.  Patient agreed and plans to go to the ER for evaluation.

## 2023-08-15 NOTE — ED Provider Notes (Signed)
 [facility] Provider Note    Event Date/Time   First MD Initiated Contact with Patient 08/15/23 1519     (approximate)   History   abnormal labs   HPI  Catherine Perez is a 43 y.o. female with no PMH who presents for evaluation of RUQ pain and elevated LFTs. Patient reports intermittent RUQ pain that radiates to the RLQ and her back since christmas. She was seen by her PCP for this as well as a physical and had lab work completed showing elevated LFTs. At the time of her ap[pointment she was having more generalized symptoms like body aches, fever and joint pain. She was started on doxycycline  for treatment of a possible tick bite [place] She has not had associated nausea, diarrhea or constipation.       Physical Exam   Triage Vital Signs: ED Triage Vitals [08/15/23 1356]  Encounter Vitals Group     BP (!) 128/91     Systolic BP Percentile      Diastolic BP Percentile      Pulse Rate (!) 105     Resp 18     Temp 99.1 F (37.3 C)     Temp Source Oral     SpO2 100 %     Weight 174 lb 2.6 oz (79 kg)     Height 5\' 4"  (1.626 m)     Head Circumference      Peak Flow      Pain Score 8     Pain Loc      Pain Education      Exclude from Growth Chart     Most recent vital signs: Vitals:   08/15/23 1356  BP: (!) 128/91  Pulse: (!) 105  Resp: 18  Temp: 99.1 F (37.3 C)  SpO2: 100%   General: Awake, no distress.  CV:  Good peripheral perfusion. RRR. Resp:  Normal effort. CTAB. Abd:  No distention. TTP in the RUQ, positive murphy's sign. Other:     ED Results / Procedures / Treatments   Labs (all labs ordered are listed, but only abnormal results are displayed) Labs Reviewed  COMPREHENSIVE METABOLIC PANEL WITH GFR - Abnormal; Notable for the following components:      Result Value   AST 59 (*)    ALT 135 (*)    All other components within normal limits  URINALYSIS, ROUTINE W REFLEX MICROSCOPIC - Abnormal; Notable for  the following components:   Color, Urine YELLOW (*)    APPearance CLEAR (*)    All other components within normal limits  POC URINE PREG, ED - Normal  LIPASE, BLOOD  CBC     RADIOLOGY  RUQ US  obtained, interpretation below.   PROCEDURES:  Critical Care performed: No  Procedures   MEDICATIONS ORDERED IN ED: Medications - No data to display   IMPRESSION / MDM / ASSESSMENT AND PLAN / ED COURSE  I reviewed the triage vital signs and the nursing notes.                             43 year old female presents for evaluation of RUQ pain and elevated LFTs. Patient was tachycardic upon presentation but this has improved at the time of my exam. VSS otherwise and patient NAD on exam.   Differential diagnosis includes, but is not limited to, acute cholecystitis, biliary colic, choledocholithiasis, hepatitis, viral illness, nephrolithiasis,  pyelonephritis.  Patient's presentation is most consistent with acute complicated illness / injury requiring diagnostic workup.  CBC and lipase unremarkable, CMP shows elevated AST and ALT.  Patient has RUQ tenderness on exam and a positive murphy's sign so will obtain RUQ US  and check UA to make sure this is not coming from a kidney stone.   Clinical Course as of 08/15/23 1757  Tue Aug 15, 2023  1600 Comprehensive metabolic panel(!) [LD]  1655 Urinalysis, Routine w reflex microscopic -Urine, Clean Catch(!) Unremarkable, no Uti or hemoglobin, low suspicion for nephrolithasis [LD]  1710 US  Abdomen Limited RUQ (LIVER/GB) Negative aside from fatty liver, but no gallstones, gall bladder wall thickening or CBD dilation [LD]  1711 Work up reassuring, suspect elevated LFTs due to fatty liver disease, patient stable for outpatient management [LD]    Clinical Course User Index [LD] Phyliss Breen, PA-C     FINAL CLINICAL IMPRESSION(S) / ED DIAGNOSES   Final diagnoses:  Elevated liver enzymes  Fatty infiltration of liver     Rx / DC  Orders   ED Discharge Orders     None        Note:  This document was prepared using Dragon voice recognition software and may include unintentional dictation errors.   Phyliss Breen, PA-C 08/15/23 1757    Claria Crofts, MD 08/15/23 [phone]

## 2023-08-15 NOTE — ED Triage Notes (Signed)
 Pt presents to the ED via POV from home. Pt reports seeing her PCP and having lab work drawn yesterday. States that her liver enzymes were elevated yesterday. Pt was sent here for US . Pt states that she typically drinks only on Saturdays.

## 2023-08-15 NOTE — ED Provider Notes (Signed)
 Care of this patient assumed from prior provider at 1630 pending ultrasound, urinalysis, and disposition. Please see prior provider note for further details.  Briefly this is a 43 year old female presenting with right upper quadrant pain and elevated LFTs at her primary care doctor yesterday.  Has been having intermittent abdominal pain since Christmas.  Had some worsened pain so she was directed to the ER for evaluation of acute cholecystitis.  Patient CBC.  CMP with improving transaminitis with AST of 59, ALT of 135.  Normal lipase.  Urine without evidence of infection or blood.  UPT negative. RUQ US  demonstrated findings of fatty liver disease, no gallstones, gallbladder wall thickening noted.  Patient was reassessed and updated on the results of her workup.  Mild ongoing pain on reevaluation.  She is comfortable with discharge home.  She will follow-up with her primary care doctor for further evaluation of her likely fatty liver disease.  Strict return precautions provided.  Patient discharged in stable condition.   Claria Crofts, MD 08/15/23 [phone]

## 2023-08-15 NOTE — Discharge Instructions (Signed)
 Your blood work today showed some improvement in your liver enzymes.  Your ultrasound here did not show signs of gallbladder disease.  It did show findings concerning for fatty liver disease, discuss this further with your primary care doctor.  Return to the ER for new or worsening symptoms.

## 2023-08-16 ENCOUNTER — Ambulatory Visit

## 2023-08-17 ENCOUNTER — Ambulatory Visit

## 2023-08-22 LAB — CYTOLOGY - PAP
Adequacy: ABSENT
Comment: NEGATIVE
Diagnosis: UNDETERMINED — AB
High risk HPV: NEGATIVE

## 2023-11-19 ENCOUNTER — Telehealth: Payer: Self-pay

## 2023-11-19 NOTE — Telephone Encounter (Signed)
 Ok for E2C2 to review.  Left message for patient. Appointment with PCP for 11/20/2023 has been cancelled and advised that we will call to reschedule.

## 2023-11-20 ENCOUNTER — Ambulatory Visit: Admitting: Nurse Practitioner

## 2023-11-21 NOTE — Telephone Encounter (Signed)
 Scheduled for 9:30

## 2023-12-05 ENCOUNTER — Encounter: Payer: Self-pay | Admitting: Nurse Practitioner

## 2023-12-05 ENCOUNTER — Ambulatory Visit: Admitting: Nurse Practitioner

## 2023-12-05 VITALS — BP 114/77 | HR 83 | Ht 63.0 in | Wt 173.2 lb

## 2023-12-05 DIAGNOSIS — R748 Abnormal levels of other serum enzymes: Secondary | ICD-10-CM | POA: Diagnosis not present

## 2023-12-05 DIAGNOSIS — Z6830 Body mass index (BMI) 30.0-30.9, adult: Secondary | ICD-10-CM | POA: Diagnosis not present

## 2023-12-05 MED ORDER — METFORMIN HCL 500 MG PO TABS: 1000.0000 mg | ORAL_TABLET | Freq: Two times a day (BID) | ORAL | 1 refills | Status: AC

## 2023-12-05 NOTE — Progress Notes (Signed)
 BP 114/77 (BP Location: Left Arm, Patient Position: Sitting, Cuff Size: Large)   Pulse 83   Ht 5' 3 (1.6 m)   Wt 173 lb 3.2 oz (78.6 kg)   SpO2 98%   BMI 30.68 kg/m    Subjective:    Patient ID: Catherine Perez, female    DOB: 02/20/1981, 43 y.o.   MRN: [id number]  HPI: Catherine Perez is a 43 y.o. female  Chief Complaint  Patient presents with   Follow-up   Patient states she is still taking the Metformin .  She does feel like the metformin  is helping curb her appetite some.  She feels like she is snacking less in the evenings.  However, she hasn't lost weight with the medication.  Patient is tolerating the medication well without side effects.    Relevant past medical, surgical, family and social history reviewed and updated as indicated. Interim medical history since our last visit reviewed. Allergies and medications reviewed and updated.  Review of Systems  Constitutional:  Negative for unexpected weight change.    Per HPI unless specifically indicated above     Objective:    BP 114/77 (BP Location: Left Arm, Patient Position: Sitting, Cuff Size: Large)   Pulse 83   Ht 5' 3 (1.6 m)   Wt 173 lb 3.2 oz (78.6 kg)   SpO2 98%   BMI 30.68 kg/m   Wt Readings from Last 3 Encounters:  12/05/23 173 lb 3.2 oz (78.6 kg)  08/15/23 174 lb 2.6 oz (79 kg)  08/14/23 174 lb 12.8 oz (79.3 kg)    Physical Exam Vitals and nursing note reviewed.  Constitutional:      General: She is not in acute distress.    Appearance: Normal appearance. She is normal weight. She is not ill-appearing, toxic-appearing or diaphoretic.  HENT:     Head: Normocephalic.     Right Ear: External ear normal.     Left Ear: External ear normal.     Nose: Nose normal.     Mouth/Throat:     Mouth: Mucous membranes are moist.     Pharynx: Oropharynx is clear.  Eyes:     General:        Right eye: No discharge.        Left eye: No discharge.     Extraocular Movements: Extraocular movements  intact.     Conjunctiva/sclera: Conjunctivae normal.     Pupils: Pupils are equal, round, and reactive to light.  Cardiovascular:     Rate and Rhythm: Normal rate and regular rhythm.     Heart sounds: No murmur heard. Pulmonary:     Effort: Pulmonary effort is normal. No respiratory distress.     Breath sounds: Normal breath sounds. No wheezing or rales.  Musculoskeletal:     Cervical back: Normal range of motion and neck supple.  Skin:    General: Skin is warm and dry.     Capillary Refill: Capillary refill takes less than 2 seconds.  Neurological:     General: No focal deficit present.     Mental Status: She is alert and oriented to person, place, and time. Mental status is at baseline.  Psychiatric:        Mood and Affect: Mood normal.        Behavior: Behavior normal.        Thought Content: Thought content normal.        Judgment: Judgment normal.     Results for orders placed  or performed during the hospital encounter of 08/15/23  Lipase, blood   Collection Time: 08/15/23  2:02 PM  Result Value Ref Range   Lipase 31 11 - 51 U/L  Comprehensive metabolic panel   Collection Time: 08/15/23  2:02 PM  Result Value Ref Range   Sodium 140 135 - 145 mmol/L   Potassium 3.8 3.5 - 5.1 mmol/L   Chloride 106 98 - 111 mmol/L   CO2 27 22 - 32 mmol/L   Glucose, Bld 94 70 - 99 mg/dL   BUN 10 6 - 20 mg/dL   Creatinine, Ser 9.33 0.44 - 1.00 mg/dL   Calcium  9.1 8.9 - 10.3 mg/dL   Total Protein 7.1 6.5 - 8.1 g/dL   Albumin 4.1 3.5 - 5.0 g/dL   AST 59 (H) 15 - 41 U/L   ALT 135 (H) 0 - 44 U/L   Alkaline Phosphatase 71 38 - 126 U/L   Total Bilirubin 0.7 0.0 - 1.2 mg/dL   GFR, Estimated >39 >39 mL/min   Anion gap 7 5 - 15  CBC   Collection Time: 08/15/23  2:02 PM  Result Value Ref Range   WBC 5.6 4.0 - 10.5 K/uL   RBC 4.61 3.87 - 5.11 MIL/uL   Hemoglobin 13.5 12.0 - 15.0 g/dL   HCT 59.8 63.9 - 53.9 %   MCV 87.0 80.0 - 100.0 fL   MCH 29.3 26.0 - 34.0 pg   MCHC 33.7 30.0 - 36.0  g/dL   RDW 86.8 88.4 - 84.4 %   Platelets 176 150 - 400 K/uL   nRBC 0.0 0.0 - 0.2 %  Urinalysis, Routine w reflex microscopic -Urine, Clean Catch   Collection Time: 08/15/23  3:58 PM  Result Value Ref Range   Color, Urine YELLOW (A) YELLOW   APPearance CLEAR (A) CLEAR   Specific Gravity, Urine 1.009 1.005 - 1.030   pH 5.0 5.0 - 8.0   Glucose, UA NEGATIVE NEGATIVE mg/dL   Hgb urine dipstick NEGATIVE NEGATIVE   Bilirubin Urine NEGATIVE NEGATIVE   Ketones, ur NEGATIVE NEGATIVE mg/dL   Protein, ur NEGATIVE NEGATIVE mg/dL   Nitrite NEGATIVE NEGATIVE   Leukocytes,Ua NEGATIVE NEGATIVE  POC urine preg, ED   Collection Time: 08/15/23  4:00 PM  Result Value Ref Range   Preg Test, Ur Negative Negative      Assessment & Plan:   Problem List Items Addressed This Visit       Other   BMI 30.0-30.9,adult   Chronic. Has been on Metformin  500mg  BID without improvement. Will increase dose to 1000mg  BID.  Follow up in 6 months.  Call sooner if concerns arise.       Other Visit Diagnoses       Elevated liver enzymes    -  Primary   CMP ordered at visit today. Will make recommendations based on results.   Relevant Orders   Comp Met (CMET)        Follow up plan: Return in about 6 months (around 06/03/2024) for CHeck up Metformin .

## 2023-12-05 NOTE — Assessment & Plan Note (Signed)
 Chronic. Has been on Metformin  500mg  BID without improvement. Will increase dose to 1000mg  BID.  Follow up in 6 months.  Call sooner if concerns arise.

## 2023-12-06 ENCOUNTER — Ambulatory Visit: Payer: Self-pay | Admitting: Nurse Practitioner

## 2023-12-06 LAB — COMPREHENSIVE METABOLIC PANEL WITH GFR
ALT: 31 IU/L (ref 0–32)
AST: 15 IU/L (ref 0–40)
Albumin: 4.4 g/dL (ref 3.9–4.9)
Alkaline Phosphatase: 62 IU/L (ref 41–116)
BUN/Creatinine Ratio: 17 (ref 9–23)
BUN: 13 mg/dL (ref 6–24)
Bilirubin Total: <0.2 mg/dL (ref 0.0–1.2)
CO2: 23 mmol/L (ref 20–29)
Calcium: 9.7 mg/dL (ref 8.7–10.2)
Chloride: 101 mmol/L (ref 96–106)
Creatinine, Ser: 0.78 mg/dL (ref 0.57–1.00)
Globulin, Total: 2.4 g/dL (ref 1.5–4.5)
Glucose: 112 mg/dL — ABNORMAL HIGH (ref 70–99)
Potassium: 4.4 mmol/L (ref 3.5–5.2)
Sodium: 139 mmol/L (ref 134–144)
Total Protein: 6.8 g/dL (ref 6.0–8.5)
eGFR: 98 mL/min/1.73 (ref >59)

## 2023-12-26 ENCOUNTER — Encounter: Payer: Self-pay | Admitting: Nurse Practitioner

## 2023-12-26 ENCOUNTER — Telehealth: Admitting: Nurse Practitioner

## 2023-12-26 DIAGNOSIS — R110 Nausea: Secondary | ICD-10-CM

## 2023-12-26 DIAGNOSIS — T383X5A Adverse effect of insulin and oral hypoglycemic [antidiabetic] drugs, initial encounter: Secondary | ICD-10-CM

## 2023-12-26 DIAGNOSIS — Z6830 Body mass index (BMI) 30.0-30.9, adult: Secondary | ICD-10-CM

## 2023-12-26 MED ORDER — PHENTERMINE HCL 15 MG PO CAPS: 15.0000 mg | ORAL_CAPSULE | ORAL | 0 refills | Status: DC

## 2023-12-26 NOTE — Assessment & Plan Note (Signed)
 Chronic.  Not able to tolerate metformin  due to side effects. Will start Phentermine 15mg  daily.  Side effects and benefits discussed.  Will reach out in a couple of weeks to discuss increasing to 30mg  dose.  Follow up in 2 months.  Call sooner if concerns arise.

## 2023-12-26 NOTE — Progress Notes (Signed)
 There were no vitals taken for this visit.   Subjective:    Patient ID: Catherine Perez, female    DOB: [birth year], 43 y.o.   MRN: [id number]  HPI: Catherine Perez is a 43 y.o. female  Chief Complaint  Patient presents with   Medication Problem    Patient states she cannot take 2 tablets twice daily of the Metformin , states it makes her very nauseous   Patient states she was having a lot of nausea with taking Metformin  1000mg  BID.  She would like to discuss going back on Phentermine.  She hasn't taken it in 6 years.  When she was taking the medication she did not have any side effects and had success with weight loss.      Relevant past medical, surgical, family and social history reviewed and updated as indicated. Interim medical history since our last visit reviewed. Allergies and medications reviewed and updated.  Review of Systems  Constitutional:  Negative for unexpected weight change.  Gastrointestinal:  Positive for nausea.    Per HPI unless specifically indicated above     Objective:    There were no vitals taken for this visit.  Wt Readings from Last 3 Encounters:  12/05/23 173 lb 3.2 oz (78.6 kg)  08/15/23 174 lb 2.6 oz (79 kg)  08/14/23 174 lb 12.8 oz (79.3 kg)    Physical Exam Vitals and nursing note reviewed.  Constitutional:      General: She is not in acute distress.    Appearance: She is not ill-appearing.  HENT:     Head: Normocephalic.     Right Ear: Hearing normal.     Left Ear: Hearing normal.     Nose: Nose normal.  Pulmonary:     Effort: Pulmonary effort is normal. No respiratory distress.  Neurological:     Mental Status: She is alert.  Psychiatric:        Mood and Affect: Mood normal.        Behavior: Behavior normal.        Thought Content: Thought content normal.        Judgment: Judgment normal.     Results for orders placed or performed in visit on 12/05/23  Comp Met (CMET)   Collection Time: 12/05/23  8:20 AM  Result  Value Ref Range   Glucose 112 (H) 70 - 99 mg/dL   BUN 13 6 - 24 mg/dL   Creatinine, Ser 9.21 0.57 - 1.00 mg/dL   eGFR 98 >40 fO/fpw/8.26   BUN/Creatinine Ratio 17 9 - 23   Sodium 139 134 - 144 mmol/L   Potassium 4.4 3.5 - 5.2 mmol/L   Chloride 101 96 - 106 mmol/L   CO2 23 20 - 29 mmol/L   Calcium  9.7 8.7 - 10.2 mg/dL   Total Protein 6.8 6.0 - 8.5 g/dL   Albumin 4.4 3.9 - 4.9 g/dL   Globulin, Total 2.4 1.5 - 4.5 g/dL   Bilirubin Total <9.7 0.0 - 1.2 mg/dL   Alkaline Phosphatase 62 41 - 116 IU/L   AST 15 0 - 40 IU/L   ALT 31 0 - 32 IU/L      Assessment & Plan:   Problem List Items Addressed This Visit       Other   BMI 30.0-30.9,adult - Primary   Chronic.  Not able to tolerate metformin  due to side effects. Will start Phentermine 15mg  daily.  Side effects and benefits discussed.  Will reach out in a couple  of weeks to discuss increasing to 30mg  dose.  Follow up in 2 months.  Call sooner if concerns arise.         Follow up plan: Return in about 2 months (around 02/25/2024) for Weight Managment (virtual).  This visit was completed via MyChart due to the restrictions of the COVID-19 pandemic. All issues as above were discussed and addressed. Physical exam was done as above through visual confirmation on MyChart. If it was felt that the patient should be evaluated in the office, they were directed there. The patient verbally consented to this visit. Location of the patient: Home Location of the provider: Office Those involved with this call:  Provider: Darice Petty, NP CMA: Laymon Metro, CMA Front Desk/Registration: Claretta Maiden This encounter was conducted via video.  I spent 30 minutes dedicated to the care of this patient on the date of this encounter to include previsit review of plan of care, medications, side effects and follow up, face to face time with the patient, and post visit ordering of testing.

## 2023-12-27 NOTE — Progress Notes (Signed)
 Scheduled

## 2024-01-08 ENCOUNTER — Ambulatory Visit

## 2024-01-08 DIAGNOSIS — W908XXA Exposure to other nonionizing radiation, initial encounter: Secondary | ICD-10-CM

## 2024-01-08 DIAGNOSIS — L578 Other skin changes due to chronic exposure to nonionizing radiation: Secondary | ICD-10-CM | POA: Diagnosis not present

## 2024-01-08 DIAGNOSIS — L820 Inflamed seborrheic keratosis: Secondary | ICD-10-CM | POA: Diagnosis not present

## 2024-01-08 DIAGNOSIS — L7211 Pilar cyst: Secondary | ICD-10-CM | POA: Diagnosis not present

## 2024-01-08 DIAGNOSIS — L821 Other seborrheic keratosis: Secondary | ICD-10-CM

## 2024-01-08 NOTE — Patient Instructions (Addendum)
 Pre-Operative Instructions You are scheduled for a surgical procedure at [facility]. We recommend you read the following instructions. If you have any questions or concerns, please call the office at [phone].  Shower and wash the entire body with soap and water the day of your surgery paying special attention to cleansing at and around the planned surgery site.  Please continue to take your anticoagulants (blood thinners) as you normally     would before and after surgery if they were prescribed by a medical provider. Stopping them could be harmful to you. We have multiple tools in dermatology to stop the bleeding even if you take an anticoagulant. If you take over the counter blood thinner such as aspirin, Ibuprofen  (Motrin , Advil  and Nuprin ), Naprosyn, Voltaren, Relafen, etc. that was not prescribed or recommended by a medical provider, we recommend that you stop taking it for a week before your surgery and wait to restart until 2 days after your surgery.  Please inform us  of all medications you are currently taking. All medications that are taken regularly should be taken the day of surgery as you always do. Nevertheless, we need to be informed of what medications you are taking prior to surgery to know whether they will affect the procedure or cause any complications.   Please inform us  of any medication allergies. Also inform us  of whether you have allergies to Latex or rubber products or whether you have had any adverse reaction to Lidocaine  or Epinephrine .  Please inform us  of any prosthetic or artificial body parts such as artificial heart valve, joint replacements, etc., or similar condition that might require preoperative antibiotics.   We recommend avoidance of alcohol at least two weeks prior to surgery and continued avoidance for at least two weeks after surgery.   We recommend discontinuation of tobacco smoking at least two weeks prior to surgery and continued  abstinence for at least two weeks after surgery.  Do not plan strenuous exercise, strenuous work or strenuous lifting for approximately four weeks after your surgery.   We request if you are unable to make your scheduled surgical appointment, please call us  at least a week in advance or as soon as you are aware of a problem so that we can cancel or reschedule the appointment.   You MAY TAKE TYLENOL  (acetaminophen ) for pain as it is not a blood thinner.   PLEASE PLAN TO BE IN TOWN FOR TWO WEEKS FOLLOWING SURGERY, THIS IS IMPORTANT SO YOU CAN BE CHECKED FOR DRESSING CHANGES, FUTURE REMOVAL AND TO MONITOR FOR POSSIBLE COMPLICATIONS.   Cryosurgery  Cryosurgery ("freezing") uses liquid nitrogen to destroy certain types of skin lesions. Lowering the temperature of the lesion in a small area surrounding skin destroys the lesion. Immediately following cryosurgery, you will notice redness and swelling of the treatment area. Blistering or weeping may occur, lasting approximately one week which will then be followed by crusting. Most areas will heal completely in 10 to 14 days.  Wash the treated areas daily. Allow soap and water to run over the areas, but do not scrub. Should a scab or crust form, allow it to fall off on its own. Do not remove or pick at it. Application of an ointment  and a bandage may make you feel more comfortable, but it is not necessary. Some people develop an allergy to Neosporin, so we recommend that Vaseline or  Aquaphor be used.  The cryotherapy site will be more sensitive than your surrounding skin. Keep  it covered, and remember to apply sunscreen every day to all your sun exposed skin. A scar may remain which is lighter or pinker than your normal skin. Your body will continue to improve your scar for up to one year; however a light-colored scar may remain.  Infection following cryotherapy is rare. However if you are worried about the appearance of the treated area, contact your  doctor. We have a physician on call at all times. If you have any concerns about the site, please call our clinic at [phone]    Due to recent changes in healthcare laws, you may see results of your pathology and/or laboratory studies on MyChart before the doctors have had a chance to review them. We understand that in some cases there may be results that are confusing or concerning to you. Please understand that not all results are received at the same time and often the doctors may need to interpret multiple results in order to provide you with the best plan of care or course of treatment. Therefore, we ask that you please give us  2 business days to thoroughly review all your results before contacting the office for clarification. Should we see a critical lab result, you will be contacted sooner.   If You Need Anything After Your Visit  If you have any questions or concerns for your doctor, please call our main line at [phone] and press option 4 to reach your doctor's medical assistant. If no one answers, please leave a voicemail as directed and we will return your call as soon as possible. Messages left after 4 pm will be answered the following business day.   You may also send us  a message via MyChart. We typically respond to MyChart messages within 1-2 business days.  For prescription refills, please ask your pharmacy to contact our office. Our fax number is [phone].  If you have an urgent issue when the clinic is closed that cannot wait until the next business day, you can page your doctor at the number below.    Please note that while we do our best to be available for urgent issues outside of office hours, we are not available 24/7.   If you have an urgent issue and are unable to reach us , you may choose to seek medical care at your doctor's office, retail clinic, urgent care center, or emergency room.  If you have a medical emergency, please immediately call 911 or go to  the emergency department.  Pager Numbers  - Dr. Hester: [phone]  - Dr. Jackquline: [phone]  - Dr. Claudene: [phone]   In the event of inclement weather, please call our main line at [phone] for an update on the status of any delays or closures.  Dermatology Medication Tips: Please keep the boxes that topical medications come in in order to help keep track of the instructions about where and how to use these. Pharmacies typically print the medication instructions only on the boxes and not directly on the medication tubes.   If your medication is too expensive, please contact our office at [phone] option 4 or send us  a message through MyChart.   We are unable to tell what your co-pay for medications will be in advance as this is different depending on your insurance coverage. However, we may be able to find a substitute medication at lower cost or fill out paperwork to get insurance to cover a needed medication.   If a prior authorization is required to get your  medication covered by your insurance company, please allow us  1-2 business days to complete this process.  Drug prices often vary depending on where the prescription is filled and some pharmacies may offer cheaper prices.  The website www.goodrx.com contains coupons for medications through different pharmacies. The prices here do not account for what the cost may be with help from insurance (it may be cheaper with your insurance), but the website can give you the price if you did not use any insurance.  - You can print the associated coupon and take it with your prescription to the pharmacy.  - You may also stop by our office during regular business hours and pick up a GoodRx coupon card.  - If you need your prescription sent electronically to a different pharmacy, notify our office through [facility] or by phone at [phone] option 4.     Si Usted Necesita Algo Despus de Su Visita  Tambin  puede enviarnos un mensaje a travs de Clinical Cytogeneticist. Por lo general respondemos a los mensajes de MyChart en el transcurso de 1 a 2 das hbiles.  Para renovar recetas, por favor pida a su farmacia que se ponga en contacto con nuestra oficina. Randi lakes de fax es [place] [phone].  Si tiene un asunto urgente cuando la clnica est cerrada y que no puede esperar hasta el siguiente da hbil, puede llamar/localizar a su doctor(a) al nmero que aparece a continuacin.   Por favor, tenga en cuenta que aunque hacemos todo lo posible para estar disponibles para asuntos urgentes fuera del horario de [place], no estamos disponibles las 24 horas del da, los 7 [address]  [address] de la [place].   Si tiene un problema urgente y no puede comunicarse con nosotros, puede optar por buscar atencin mdica  en el consultorio de su doctor(a), en una clnica privada, en un centro de atencin urgente o en una sala de emergencias.  Si tiene engineer, drilling, por favor llame inmediatamente al 911 o vaya a la sala de emergencias.  Nmeros de bper  - Dr. Hester: [phone]  - Dra. Jackquline: [phone]  - Dr. Claudene: [phone]   En caso de inclemencias del tiempo, por favor llame a landry capes principal al [phone] para una actualizacin sobre el [place] de cualquier retraso o cierre.  Consejos para la medicacin en dermatologa: Por favor, guarde las cajas en las que vienen los medicamentos de uso tpico para ayudarle a seguir las instrucciones sobre dnde y cmo usarlos. Las farmacias generalmente imprimen las instrucciones del medicamento slo en las cajas y no directamente en los tubos del [place].   Si su medicamento es muy caro, por favor, pngase en contacto con landry rieger llamando al [phone] y presione la opcin 4 o envenos un mensaje a travs de Clinical Cytogeneticist.   No podemos decirle cul ser su copago por los medicamentos por adelantado ya que esto es diferente dependiendo de la cobertura de su  seguro. Sin embargo, es posible que podamos encontrar un medicamento sustituto a audiological scientist un formulario para que el seguro cubra el medicamento que se considera necesario.   Si se requiere una autorizacin previa para que su compaa de seguros cubra su medicamento, por favor permtanos de 1 a 2 das hbiles para completar este proceso.  Los precios de los medicamentos varan con frecuencia dependiendo del environmental consultant de dnde se surte la receta y alguna farmacias pueden ofrecer precios ms baratos.  El sitio web www.goodrx.com tiene cupones para medicamentos de health and safety inspector. Los  precios aqu no tienen en cuenta lo que podra costar con la ayuda del seguro (puede ser ms barato con su seguro), pero el sitio web puede darle el precio si no utiliz tourist information centre manager.  - Puede imprimir el cupn correspondiente y llevarlo con su receta a la farmacia.  - Tambin puede pasar por nuestra oficina durante el horario de atencin regular y education officer, museum una tarjeta de cupones de GoodRx.  - Si necesita que su receta se enve electrnicamente a una farmacia diferente, informe a nuestra oficina a travs de MyChart de [place] o por telfono llamando al [phone] y presione la opcin 4.

## 2024-01-08 NOTE — Progress Notes (Signed)
 Subjective   Catherine Perez is a 43 y.o. female who presents for the following: Lesion(s) of concern . Patient is new patient.  Today patient reports: LOC at right scalp present since high school growing and tender. LOC at right breast present appeared x2-3 weeks ago, flakey. No personal hx of skin cancer, family hx of skin cancer- mother.   Review of Systems:    No other skin or systemic complaints except as noted in HPI or Assessment and Plan.  The following portions of the chart were reviewed this encounter and updated as appropriate: medications, allergies, medical history  Relevant Medical History:  Family history of skin cancer - Mother   Objective  Well appearing patient in no apparent distress; mood and affect are within normal limits. Examination was performed of the: Focused Exam of: Scalp, right breast    Examination notable for: Seborrheic Keratosis(es): Stuck-on appearing keratotic papule(s) on the trunk, some  irritated with redness, crusting, edema, and/or partial avulsion, Actinic Damage/Elastosis: chronic sun damage: dyspigmentation, telangiectasia, and wrinkling.  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   Examination limited by: Undergarments, Shoes or socks , Clothing, and Patient deferred removal     Right breast x1 Stuck on waxy paps with erythema  Assessment & Plan   Benign Lesions/ Findings:  -Seborrheic Keratosis  - Reassurance provided regarding the benign appearance of lesions noted on exam today; no treatment is indicated in the absence of symptoms/changes. - Reinforced importance of photoprotective strategies including liberal and frequent sunscreen use of a broad-spectrum SPF 30 or greater, use of protective clothing, and sun avoidance for prevention of cutaneous malignancy and photoaging.  Counseled patient on the importance of regular self-skin monitoring as well as routine clinical skin examinations as scheduled.   ACTINIC DAMAGE - chronic,  secondary to cumulative UV radiation exposure/sun exposure over time - diffuse scaly erythematous macules with underlying dyspigmentation - Recommend daily broad spectrum sunscreen SPF 30+ to sun-exposed areas, reapply every 2 hours as needed.  - Recommend staying in the shade or wearing long sleeves, sun glasses (UVA+UVB protection) and wide brim hats (4-inch brim around the entire circumference of the hat). - Call for new or changing lesions.   Pilar cyst scalp  - Explained to patient this most likely is consistent with a pilar cyst - Benign, patient reassured - Given that the lesion is symptomatic, patient would like to proceed with excision. They will be scheduled for surgical removal.    Level of service outlined above   Procedures, orders, diagnosis for this visit:  INFLAMED SEBORRHEIC KERATOSIS Right breast x1 Symptomatic, irritating, patient would like treated. Destruction of lesion - Right breast x1 Complexity: simple   Destruction method: cryotherapy   Informed consent: discussed and consent obtained   Timeout:  patient name, date of birth, surgical site, and procedure verified Lesion destroyed using liquid nitrogen: Yes   Region frozen until ice ball extended beyond lesion: Yes   Cryo cycles: 1 or 2. Outcome: patient tolerated procedure well with no complications   Post-procedure details: wound care instructions given   Additional details:  Prior to procedure, discussed risks of blister formation, small wound, skin dyspigmentation, or rare scar following cryotherapy. Recommend Vaseline ointment to treated areas while healing.    Inflamed seborrheic keratosis -     Destruction of lesion    Return to clinic: Return for pilar cyst surgery, w/ Dr. Raymund.  I, Jacquelynn V. Wilfred, CMA, am acting as scribe for Lauraine BROCKS  Raymund, MD.  Documentation: I have reviewed the above documentation for accuracy and completeness, and I agree with the above.  Lauraine JAYSON Raymund, MD

## 2024-02-26 ENCOUNTER — Encounter: Payer: Self-pay | Admitting: Nurse Practitioner

## 2024-02-26 ENCOUNTER — Telehealth (INDEPENDENT_AMBULATORY_CARE_PROVIDER_SITE_OTHER): Admitting: Nurse Practitioner

## 2024-02-26 VITALS — Ht 62.99 in

## 2024-02-26 DIAGNOSIS — Z713 Dietary counseling and surveillance: Secondary | ICD-10-CM | POA: Diagnosis not present

## 2024-02-26 DIAGNOSIS — Z6830 Body mass index (BMI) 30.0-30.9, adult: Secondary | ICD-10-CM | POA: Diagnosis not present

## 2024-02-26 MED ORDER — PHENTERMINE HCL 15 MG PO CAPS: 15.0000 mg | ORAL_CAPSULE | ORAL | 0 refills | Status: DC

## 2024-02-26 NOTE — Progress Notes (Signed)
 "  Ht 5' 2.99 (1.6 m)   LMP 02/11/2024 (Exact Date)   BMI 30.69 kg/m    Subjective:    Patient ID: Catherine Perez, female    DOB: 1980/06/19, 43 y.o.   MRN: [id number]  HPI: Catherine Perez is a 43 y.o. female  Chief Complaint  Patient presents with   Weight Management Screening   Patient was started on Phentermine  at last visit.  States she can tell it is working well for her.  States she doesn't have a scale but she feels her clothes fitting looser and a decrease in her appetite.  She is not having any side effects.  No palpitations, increased anxiety or insomnia.      Relevant past medical, surgical, family and social history reviewed and updated as indicated. Interim medical history since our last visit reviewed. Allergies and medications reviewed and updated.  Review of Systems  Constitutional:  Negative for unexpected weight change.  Gastrointestinal:  Positive for nausea.    Per HPI unless specifically indicated above     Objective:    Ht 5' 2.99 (1.6 m)   LMP 02/11/2024 (Exact Date)   BMI 30.69 kg/m   Wt Readings from Last 3 Encounters:  12/05/23 173 lb 3.2 oz (78.6 kg)  08/15/23 174 lb 2.6 oz (79 kg)  08/14/23 174 lb 12.8 oz (79.3 kg)    Physical Exam Vitals and nursing note reviewed.  Constitutional:      General: She is not in acute distress.    Appearance: She is not ill-appearing.  HENT:     Head: Normocephalic.     Right Ear: Hearing normal.     Left Ear: Hearing normal.     Nose: Nose normal.  Pulmonary:     Effort: Pulmonary effort is normal. No respiratory distress.  Neurological:     Mental Status: She is alert.  Psychiatric:        Mood and Affect: Mood normal.        Behavior: Behavior normal.        Thought Content: Thought content normal.        Judgment: Judgment normal.     Results for orders placed or performed in visit on 12/05/23  Comp Met (CMET)   Collection Time: 12/05/23  8:20 AM  Result Value Ref Range   Glucose  112 (H) 70 - 99 mg/dL   BUN 13 6 - 24 mg/dL   Creatinine, Ser 9.21 0.57 - 1.00 mg/dL   eGFR 98 >40 fO/fpw/8.26   BUN/Creatinine Ratio 17 9 - 23   Sodium 139 134 - 144 mmol/L   Potassium 4.4 3.5 - 5.2 mmol/L   Chloride 101 96 - 106 mmol/L   CO2 23 20 - 29 mmol/L   Calcium  9.7 8.7 - 10.2 mg/dL   Total Protein 6.8 6.0 - 8.5 g/dL   Albumin 4.4 3.9 - 4.9 g/dL   Globulin, Total 2.4 1.5 - 4.5 g/dL   Bilirubin Total <9.7 0.0 - 1.2 mg/dL   Alkaline Phosphatase 62 41 - 116 IU/L   AST 15 0 - 40 IU/L   ALT 31 0 - 32 IU/L      Assessment & Plan:   Problem List Items Addressed This Visit   None     Follow up plan: No follow-ups on file.  This visit was completed via MyChart due to the restrictions of the COVID-19 pandemic. All issues as above were discussed and addressed. Physical exam was done as above  through visual confirmation on MyChart. If it was felt that the patient should be evaluated in the office, they were directed there. The patient verbally consented to this visit. Location of the patient: Home Location of the provider: Office Those involved with this call:  Provider: Darice Petty, NP CMA: Joya Louder, CMA Front Desk/Registration: Claretta Maiden This encounter was conducted via video.  I spent 20 minutes dedicated to the care of this patient on the date of this encounter to include previsit review of plan of care, medications, side effects and follow up, face to face time with the patient, and post visit ordering of testing.       "

## 2024-02-26 NOTE — Assessment & Plan Note (Signed)
 Chronic. Improved.  Do not have an updated weight. Will get at next visit.  Refill of 15mg  phentermine  dose sent to the pharmacy. Continue with current regimen.  Follow up in office in 1 month.

## 2024-03-02 ENCOUNTER — Other Ambulatory Visit: Payer: Self-pay | Admitting: Nurse Practitioner

## 2024-04-10 ENCOUNTER — Encounter: Payer: Self-pay | Admitting: Nurse Practitioner

## 2024-04-10 MED ORDER — PHENTERMINE HCL 15 MG PO CAPS: 15.0000 mg | ORAL_CAPSULE | ORAL | 0 refills | Status: AC

## 2024-06-03 ENCOUNTER — Ambulatory Visit: Admitting: Nurse Practitioner
